# Patient Record
Sex: Male | Born: 1960 | Race: White | Hispanic: No | Marital: Married | Smoking: Never smoker
Health system: Southern US, Community
[De-identification: ages and names within clinical notes are randomized; demographics above are authoritative.]

## PROBLEM LIST (undated history)

## (undated) DIAGNOSIS — J309 Allergic rhinitis, unspecified: Secondary | ICD-10-CM

## (undated) DIAGNOSIS — K219 Gastro-esophageal reflux disease without esophagitis: Secondary | ICD-10-CM

## (undated) DIAGNOSIS — Z87898 Personal history of other specified conditions: Secondary | ICD-10-CM

## (undated) DIAGNOSIS — I517 Cardiomegaly: Secondary | ICD-10-CM

## (undated) DIAGNOSIS — J45909 Unspecified asthma, uncomplicated: Secondary | ICD-10-CM

## (undated) HISTORY — PX: DENTAL SURGERY: SHX609

## (undated) HISTORY — PX: HERNIA REPAIR: SHX51

## (undated) HISTORY — PX: COLONOSCOPY: SHX174

## (undated) HISTORY — DX: Unspecified asthma, uncomplicated: J45.909

## (undated) HISTORY — DX: Allergic rhinitis, unspecified: J30.9

---

## 2004-02-24 ENCOUNTER — Emergency Department (HOSPITAL_COMMUNITY): Admission: EM | Admit: 2004-02-24 | Discharge: 2004-02-25 | Payer: Self-pay | Admitting: *Deleted

## 2005-04-13 IMAGING — CR DG TIBIA/FIBULA 2V*R*
2 series · 2 of 2 positions shown · non-contrast
Comparison: none

CLINICAL DATA: patient fell of skateboarding and has pain along the lateral aspect of the leg
 TWO VIEW RIGHT TIBIA/FIBULA
 No prior studies.

[view not recorded (1 of 2)]
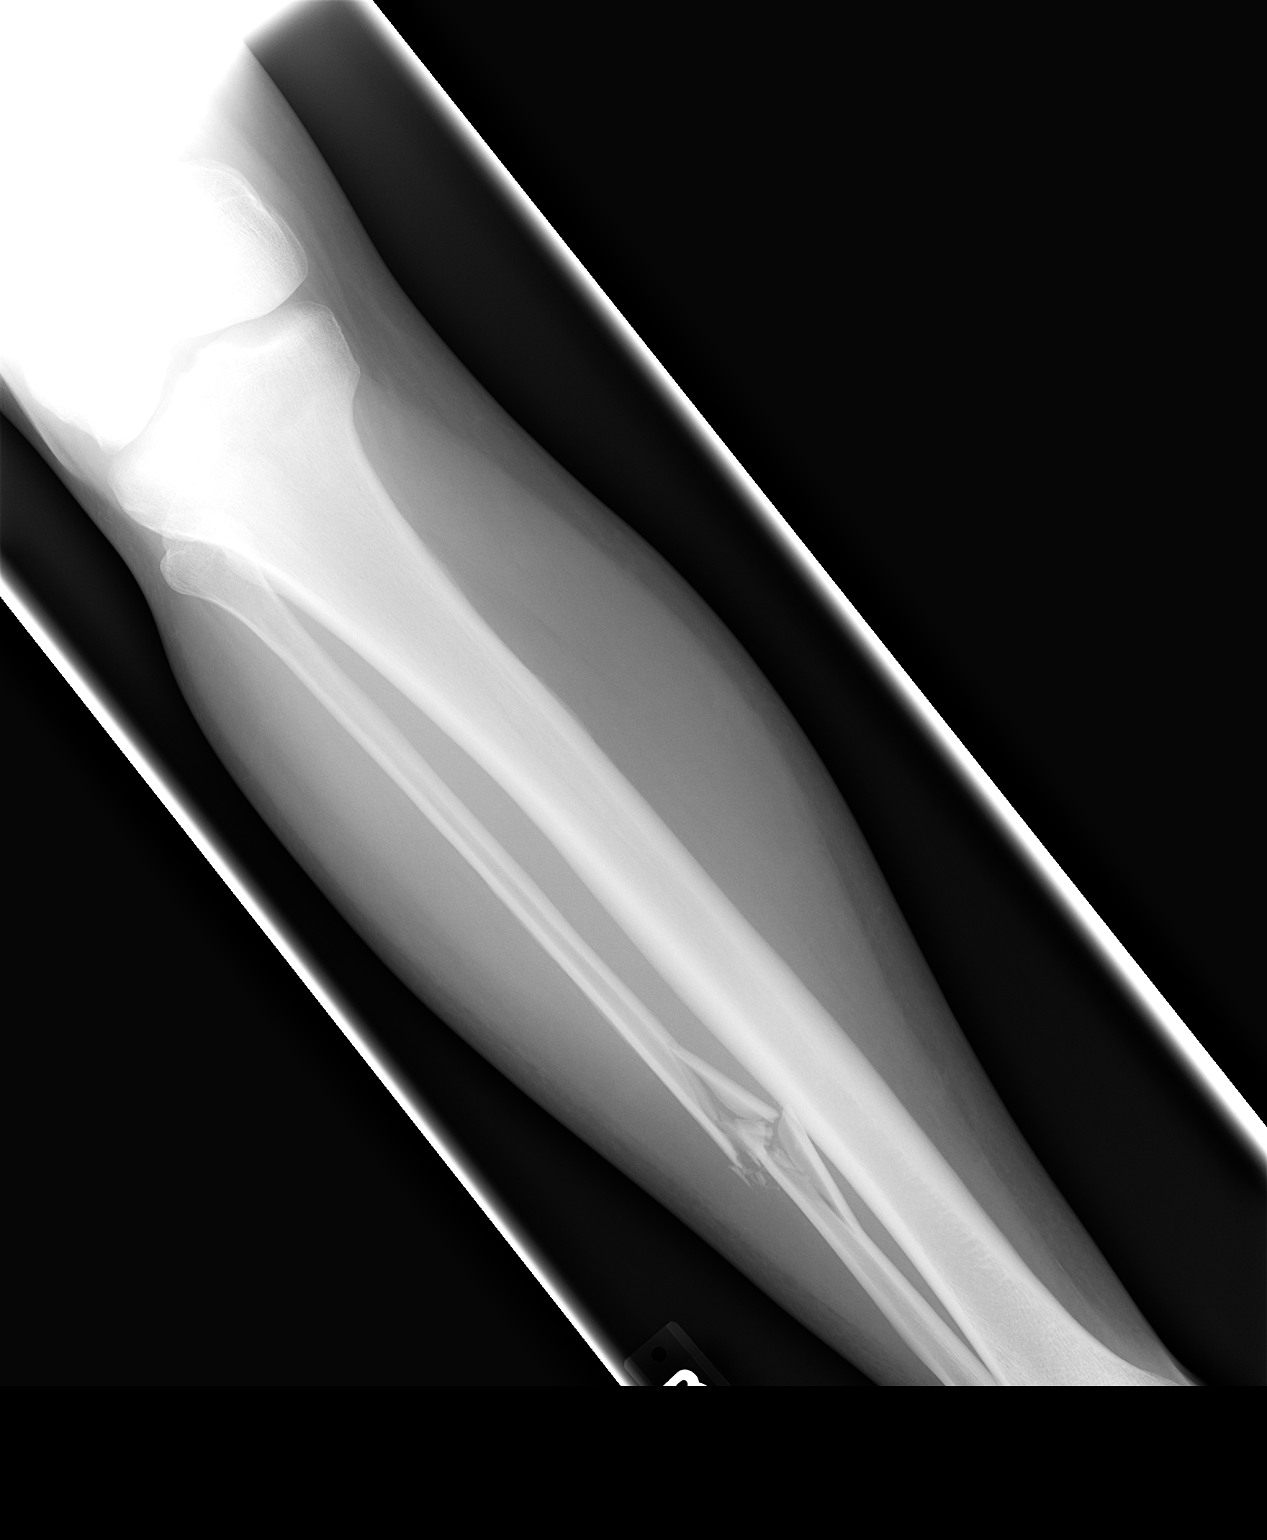

[view not recorded (2 of 2)]
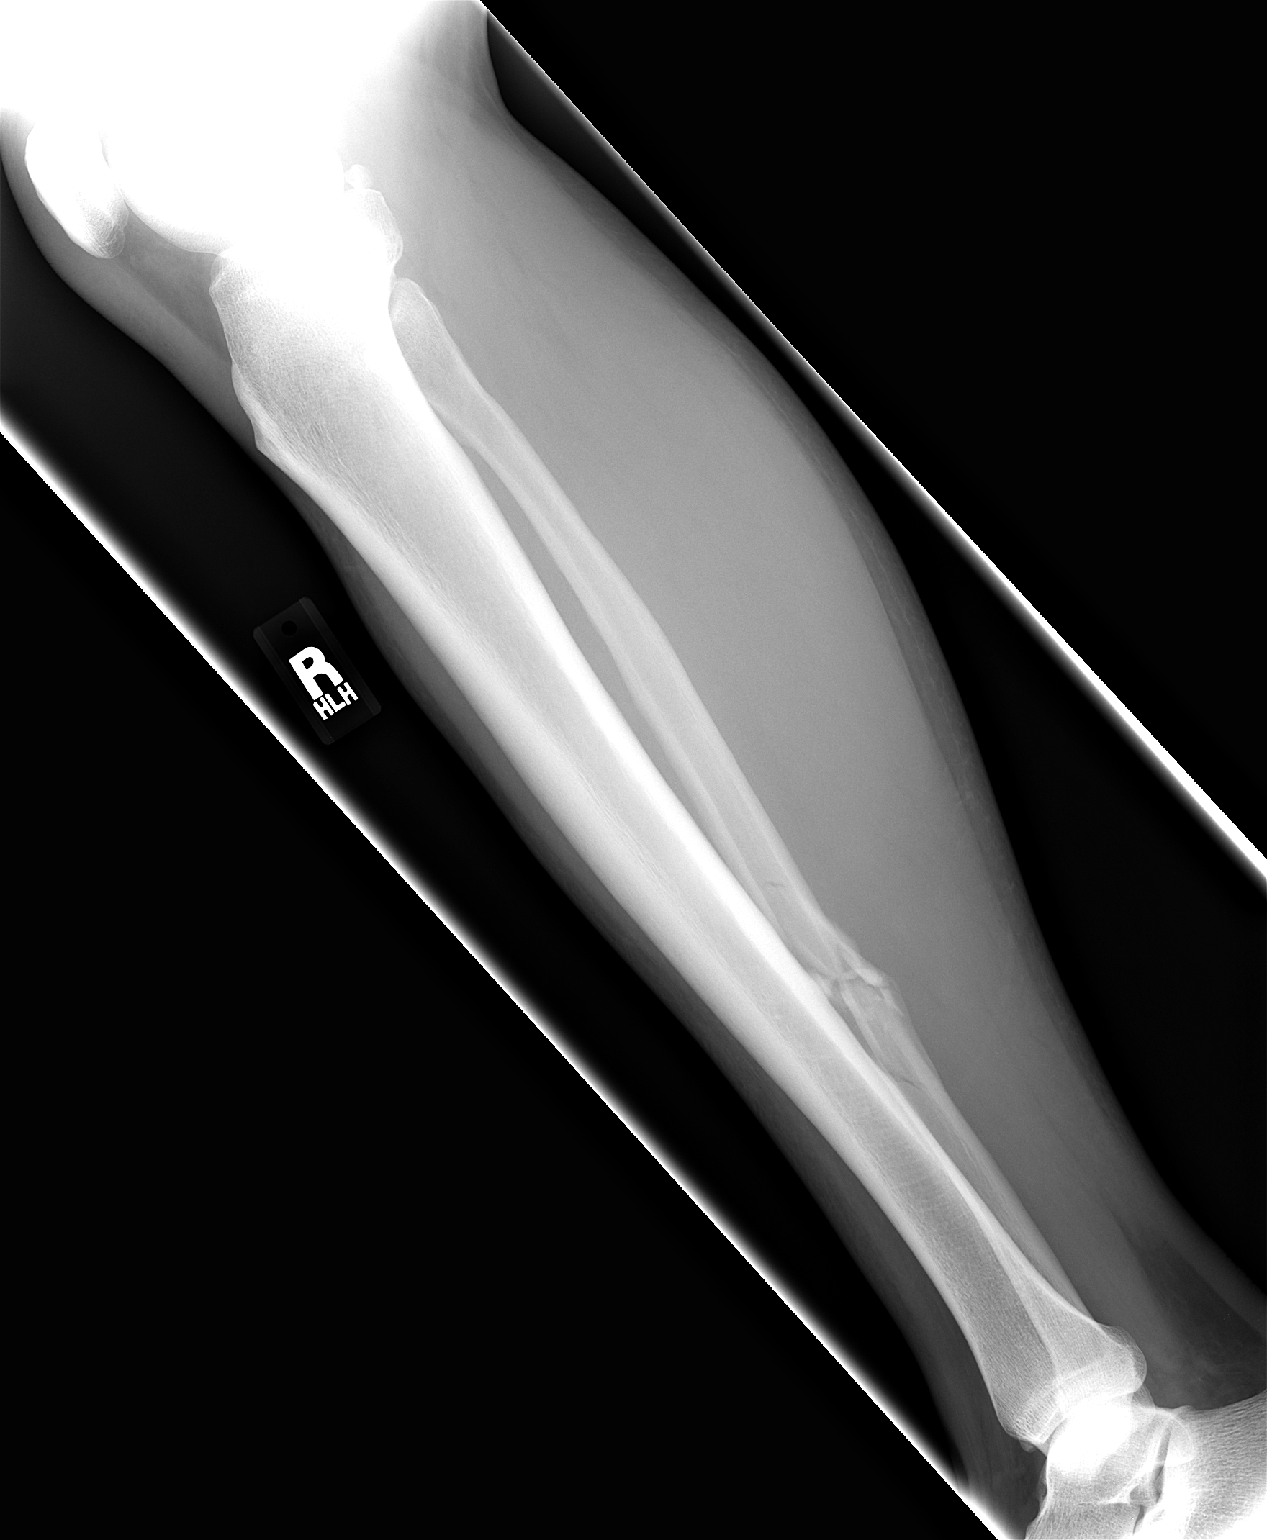

[2 of 2 positions shown; findings below may reference images not displayed]

FINDINGS: There is a comminuted fracture of the fibula at the junction of the mid and distal thirds of the diaphysis.  The tibia appears intact.
 IMPRESSION
 Comminuted fibular diaphyseal fracture.

## 2015-06-10 ENCOUNTER — Ambulatory Visit
Admission: RE | Admit: 2015-06-10 | Discharge: 2015-06-10 | Disposition: A | Discharge status: Home / Self Care | Payer: Managed Care, Other (non HMO) | Source: Ambulatory Visit | Attending: Allergy | Admitting: Allergy

## 2015-06-10 ENCOUNTER — Other Ambulatory Visit: Payer: Self-pay | Admitting: Allergy

## 2015-06-10 DIAGNOSIS — J45909 Unspecified asthma, uncomplicated: Secondary | ICD-10-CM | POA: Diagnosis not present

## 2015-06-10 DIAGNOSIS — J4530 Mild persistent asthma, uncomplicated: Secondary | ICD-10-CM

## 2015-06-10 DIAGNOSIS — I517 Cardiomegaly: Secondary | ICD-10-CM | POA: Insufficient documentation

## 2015-09-06 ENCOUNTER — Encounter (INDEPENDENT_AMBULATORY_CARE_PROVIDER_SITE_OTHER): Payer: Self-pay

## 2015-09-06 ENCOUNTER — Ambulatory Visit (INDEPENDENT_AMBULATORY_CARE_PROVIDER_SITE_OTHER): Payer: Managed Care, Other (non HMO) | Admitting: Pulmonary Disease

## 2015-09-06 ENCOUNTER — Encounter: Payer: Self-pay | Admitting: Pulmonary Disease

## 2015-09-06 VITALS — BP 118/82 | HR 83 | Ht 68.0 in | Wt 174.0 lb

## 2015-09-06 DIAGNOSIS — R05 Cough: Secondary | ICD-10-CM

## 2015-09-06 DIAGNOSIS — J45909 Unspecified asthma, uncomplicated: Secondary | ICD-10-CM | POA: Diagnosis not present

## 2015-09-06 DIAGNOSIS — K219 Gastro-esophageal reflux disease without esophagitis: Secondary | ICD-10-CM

## 2015-09-06 DIAGNOSIS — R059 Cough, unspecified: Secondary | ICD-10-CM

## 2015-09-06 DIAGNOSIS — J309 Allergic rhinitis, unspecified: Secondary | ICD-10-CM

## 2015-09-06 MED ORDER — ESOMEPRAZOLE MAGNESIUM 40 MG PO PACK: 40.0000 mg | PACK | Freq: Every day | ORAL | Status: DC

## 2015-09-06 NOTE — Patient Instructions (Signed)
Continue Zyrtec and Breo Begin Nexium 40 mg daily Follow up in 4-6 weeks with pulmonary function tests at that time

## 2015-09-06 NOTE — Progress Notes (Signed)
PULMONARY CONSULT NOTE  Date of initial consultation: 09/06/15 Reason for consultation: Chronic cough   HPI:  36 M who has had chronic throat clearing cough since his teens. He has seen Dr [place] Callas, allergist, who diagnosed asthma 2-3 yrs ago based on spirometry. He has been treated with montelukast and more recently had Breo added to his regimen. E does not believe that Virgel Bouquet has helped his cough. He reports mild DOE without much day to day variation. He has a past history of repeated bouts of "bronchitis". He notes his cough is worse when he exerts himself in hot weather or conditions. He denies nocturnal awakenings due to cough or dyspnea. He denies heartburn symptoms, chest pain, hemoptysis, LE edema and calf tenderness.  Past Medical History  Diagnosis Date  . Allergic rhinitis   . Asthma     History reviewed. No pertinent past surgical history.  MEDICATIONS: I have reviewed all medications and confirmed regimen as documented  Social History   Social History  . Marital Status: Married    Spouse Name: N/A  . Number of Children: N/A  . Years of Education: N/A   Occupational History  . Not on file.   Social History Main Topics  . Smoking status: Never Smoker   . Smokeless tobacco: Not on file  . Alcohol Use: No  . Drug Use: No  . Sexual Activity: Not on file   Other Topics Concern  . Not on file   Social History Narrative  . No narrative on file    Family History  Problem Relation Age of Onset  . Asthma Mother   . Asthma Maternal Aunt   . COPD Maternal Grandfather     ROS: No fever, myalgias/arthralgias, unexplained weight loss or weight gain No new focal weakness or sensory deficits No otalgia, hearing loss, visual changes, nasal and sinus symptoms, mouth and throat problems No neck pain or adenopathy No abdominal pain, N/V/D, diarrhea, change in bowel pattern No dysuria, change in urinary pattern No LE edema or calf tenderness   Filed Vitals:   09/06/15  0916  BP: 118/82  Pulse: 83  Height:  (1.727 m)  Weight: 174 lb (78.926 kg)  SpO2: 95%     EXAM:  Gen: WDWN, No distress HEENT: NCAT, TMs and canals normal, sclera white, nares and nasal mucosa normal, oropharynx normal Neck: Supple without LAN, thyromegaly, JVD Lungs: breath sounds full, percussion note normal throughout, No adventitious sounds Cardiovascular: Reg rhythm, rate normal, no murmurs noted Abdomen: Soft, nontender, normal BS Ext: without clubbing, cyanosis, edema Neuro: CNs grossly intact, motor and sensory intact, DTRs symmetric Skin: Limited exam, no lesions noted  DATA:   No flowsheet data found.  No flowsheet data found.  CXR:  06/10/15 - entirely normal  IMPRESSION:     ICD-9-CM ICD-10-CM   1. Cough 786.2 R05 Pulmonary function test  2. Allergic rhinitis, unspecified allergic rhinitis type 477.9 J30.9   3. Asthma, unspecified asthma severity, uncomplicated 493.90 J45.909 Pulmonary function test  4. Gastroesophageal reflux disease, esophagitis presence not specified 530.81 K21.9    His cough is likely multifactorial due to asthma, rhinosinusitis which are already medically treated. The throat clearing nature suggests GERD/LPR as a likely contributor as well    PLAN:  Add Nexium 40 mg daily Cont montelukast/Breo/cetirizine  Follow up in 4-6 weeks with PFTs   Merwyn Katos, MD [facility] [place] Pulmonary, Critical Care Medicine

## 2015-10-07 ENCOUNTER — Other Ambulatory Visit: Payer: Self-pay | Admitting: *Deleted

## 2015-10-07 ENCOUNTER — Ambulatory Visit (INDEPENDENT_AMBULATORY_CARE_PROVIDER_SITE_OTHER): Payer: Managed Care, Other (non HMO) | Admitting: *Deleted

## 2015-10-07 DIAGNOSIS — R059 Cough, unspecified: Secondary | ICD-10-CM

## 2015-10-07 DIAGNOSIS — R05 Cough: Secondary | ICD-10-CM | POA: Diagnosis not present

## 2015-10-07 DIAGNOSIS — J45909 Unspecified asthma, uncomplicated: Secondary | ICD-10-CM

## 2015-10-07 LAB — PULMONARY FUNCTION TEST
DL/VA % PRED: 99 %
DL/VA: 4.48 ml/min/mmHg/L
DLCO UNC % PRED: 87 %
DLCO unc: 25.92 ml/min/mmHg
FEF 25-75 POST: 3.32 L/s
FEF 25-75 PRE: 5.21 L/s
FEF2575-%CHANGE-POST: -36 %
FEF2575-%PRED-PRE: 164 %
FEF2575-%Pred-Post: 104 %
FEV1-%Change-Post: -5 %
FEV1-%Pred-Post: 88 %
FEV1-%Pred-Pre: 94 %
FEV1-PRE: 3.39 L
FEV1-Post: 3.2 L
FEV1FVC-%CHANGE-POST: -3 %
FEV1FVC-%Pred-Pre: 114 %
FEV6-%CHANGE-POST: -1 %
FEV6-%PRED-POST: 84 %
FEV6-%Pred-Pre: 85 %
FEV6-Post: 3.76 L
FEV6-Pre: 3.82 L
FEV6FVC-%Pred-Post: 104 %
FEV6FVC-%Pred-Pre: 104 %
FVC-%Change-Post: -2 %
FVC-%Pred-Post: 80 %
FVC-%Pred-Pre: 82 %
FVC-Post: 3.76 L
FVC-Pre: 3.86 L
POST FEV1/FVC RATIO: 85 %
PRE FEV1/FVC RATIO: 88 %
Post FEV6/FVC ratio: 100 %
Pre FEV6/FVC Ratio: 100 %
RV % pred: 76 %
RV: 1.51 L
TLC % PRED: 82 %
TLC: 5.44 L

## 2015-10-07 MED ORDER — OMEPRAZOLE 20 MG PO CPDR: 20.0000 mg | DELAYED_RELEASE_CAPSULE | Freq: Every day | ORAL | Status: DC

## 2015-10-07 NOTE — Progress Notes (Signed)
PFT performed today. 

## 2015-10-11 ENCOUNTER — Ambulatory Visit (INDEPENDENT_AMBULATORY_CARE_PROVIDER_SITE_OTHER): Payer: Managed Care, Other (non HMO) | Admitting: Pulmonary Disease

## 2015-10-11 ENCOUNTER — Encounter: Payer: Self-pay | Admitting: Pulmonary Disease

## 2015-10-11 VITALS — BP 120/62 | HR 72 | Ht 68.0 in | Wt 174.6 lb

## 2015-10-11 DIAGNOSIS — R059 Cough, unspecified: Secondary | ICD-10-CM

## 2015-10-11 DIAGNOSIS — J387 Other diseases of larynx: Secondary | ICD-10-CM | POA: Diagnosis not present

## 2015-10-11 DIAGNOSIS — K219 Gastro-esophageal reflux disease without esophagitis: Secondary | ICD-10-CM

## 2015-10-11 DIAGNOSIS — R05 Cough: Secondary | ICD-10-CM | POA: Diagnosis not present

## 2015-10-12 NOTE — Progress Notes (Signed)
PULMONARY FOLLOW UP NOTE  Date of initial consultation: 09/06/15 Reason for consultation: Chronic cough  27 M who has had chronic throat clearing cough since his teens. He has seen Dr [place] Callas, allergist, who diagnosed asthma 2-3 yrs ago based on spirometry. He has been treated with montelukast and more recently had Breo added to his regimen. E does not believe that Virgel Bouquet has helped his cough. He reports mild DOE without much day to day variation. He has a past history of repeated bouts of "bronchitis". He notes his cough is worse when he exerts himself in hot weather or conditions. He denies nocturnal awakenings due to cough or dyspnea. He denies heartburn symptoms, chest pain, hemoptysis, LE edema and calf tenderness. Initial IMP/plan: Cough due to GERD/LPR: Nexium 40 mg daily. Cont montelukast/Breo/cetirizine  Follow up in 4-6 weeks with PFTs.   02/28 ROV 02/28: Cough much improved to resolved. PFTs entirely normal. IMPRESSION: Cough due to GERD/LPR. Continue PPI (now on omeprazole) for 2 more weeks. May trial off PPI after that and use PRN for cough or GERD sx. Also discussed possibility of using OTC H2RAs. F/U PRN  SUBJ: Cough fully resolved. No new complaints. Was not approved for esomperazole. Using omeprazole instead  Filed Vitals:   10/11/15 0859  BP: 120/62  Pulse: 72  Height:  (1.727 m)  Weight: 174 lb 9.6 oz (79.198 kg)  SpO2: 94%   EXAM:  Gen: WDWN, No distress HEENT: NCAT, oropharynx normal Neck: Supple without LAN, thyromegaly, JVD Lungs: breath sounds full, percussion note normal throughout, No adventitious sounds Cardiovascular: Reg rhythm, rate normal, no murmurs noted Abdomen: Soft, nontender, normal BS Ext: without clubbing, cyanosis, edema  DATA:  No new data  IMPRESSION:     ICD-9-CM ICD-10-CM   1. Cough 786.2 R05   2. LPRD (laryngopharyngeal reflux disease) 478.79 J38.7    Cough is resolved after starting PPI. This essentially confirms the dx of  GERD/LPR  PLAN:  Continue PPI for 2 more weeks on a scheduled basis. After that, he may try off or transition to H2RA F/U PRN   Merwyn Katos, MD [facility] Pulmonary, Critical Care Medicine

## 2016-05-07 ENCOUNTER — Other Ambulatory Visit: Payer: Self-pay

## 2016-05-07 MED ORDER — OMEPRAZOLE 20 MG PO CPDR: 20.0000 mg | DELAYED_RELEASE_CAPSULE | Freq: Every day | ORAL | 0 refills | Status: DC

## 2016-06-06 ENCOUNTER — Other Ambulatory Visit: Payer: Self-pay | Admitting: *Deleted

## 2016-06-06 MED ORDER — OMEPRAZOLE 20 MG PO CPDR: 20.0000 mg | DELAYED_RELEASE_CAPSULE | Freq: Every day | ORAL | 0 refills | Status: DC

## 2016-06-29 ENCOUNTER — Other Ambulatory Visit: Payer: Self-pay | Admitting: *Deleted

## 2016-06-29 MED ORDER — OMEPRAZOLE 20 MG PO CPDR: 20.0000 mg | DELAYED_RELEASE_CAPSULE | Freq: Every day | ORAL | 0 refills | Status: DC

## 2016-07-27 IMAGING — CR DG CHEST 2V
1 series · 2 of 2 positions shown · non-contrast
Comparison: None.

CLINICAL DATA: Asthma

EXAM:
CHEST  2 VIEW

[Series 1: w chest pa · 0.14mm/px · 2 of 2 slices shown]
[im 1/2]
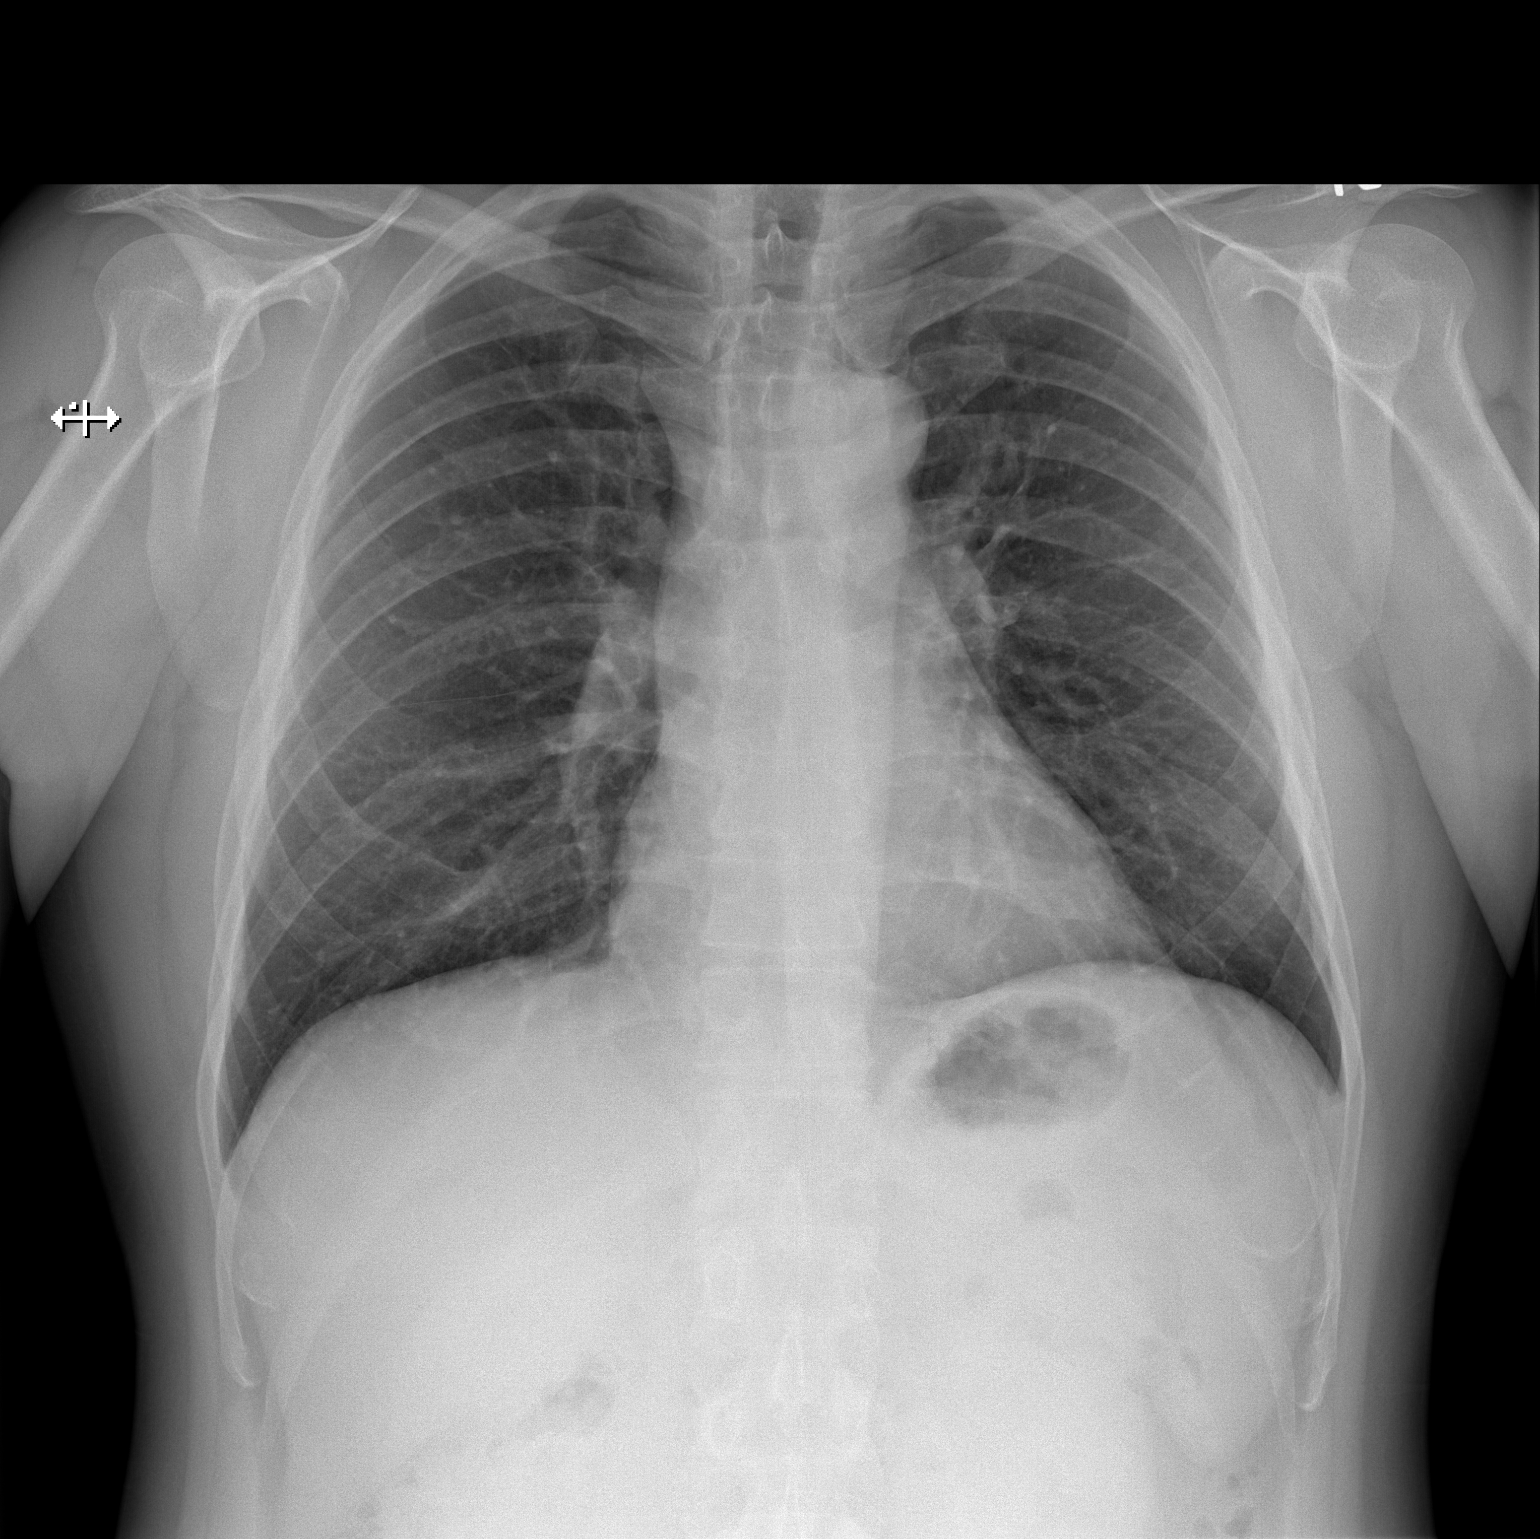
[im 2/2]
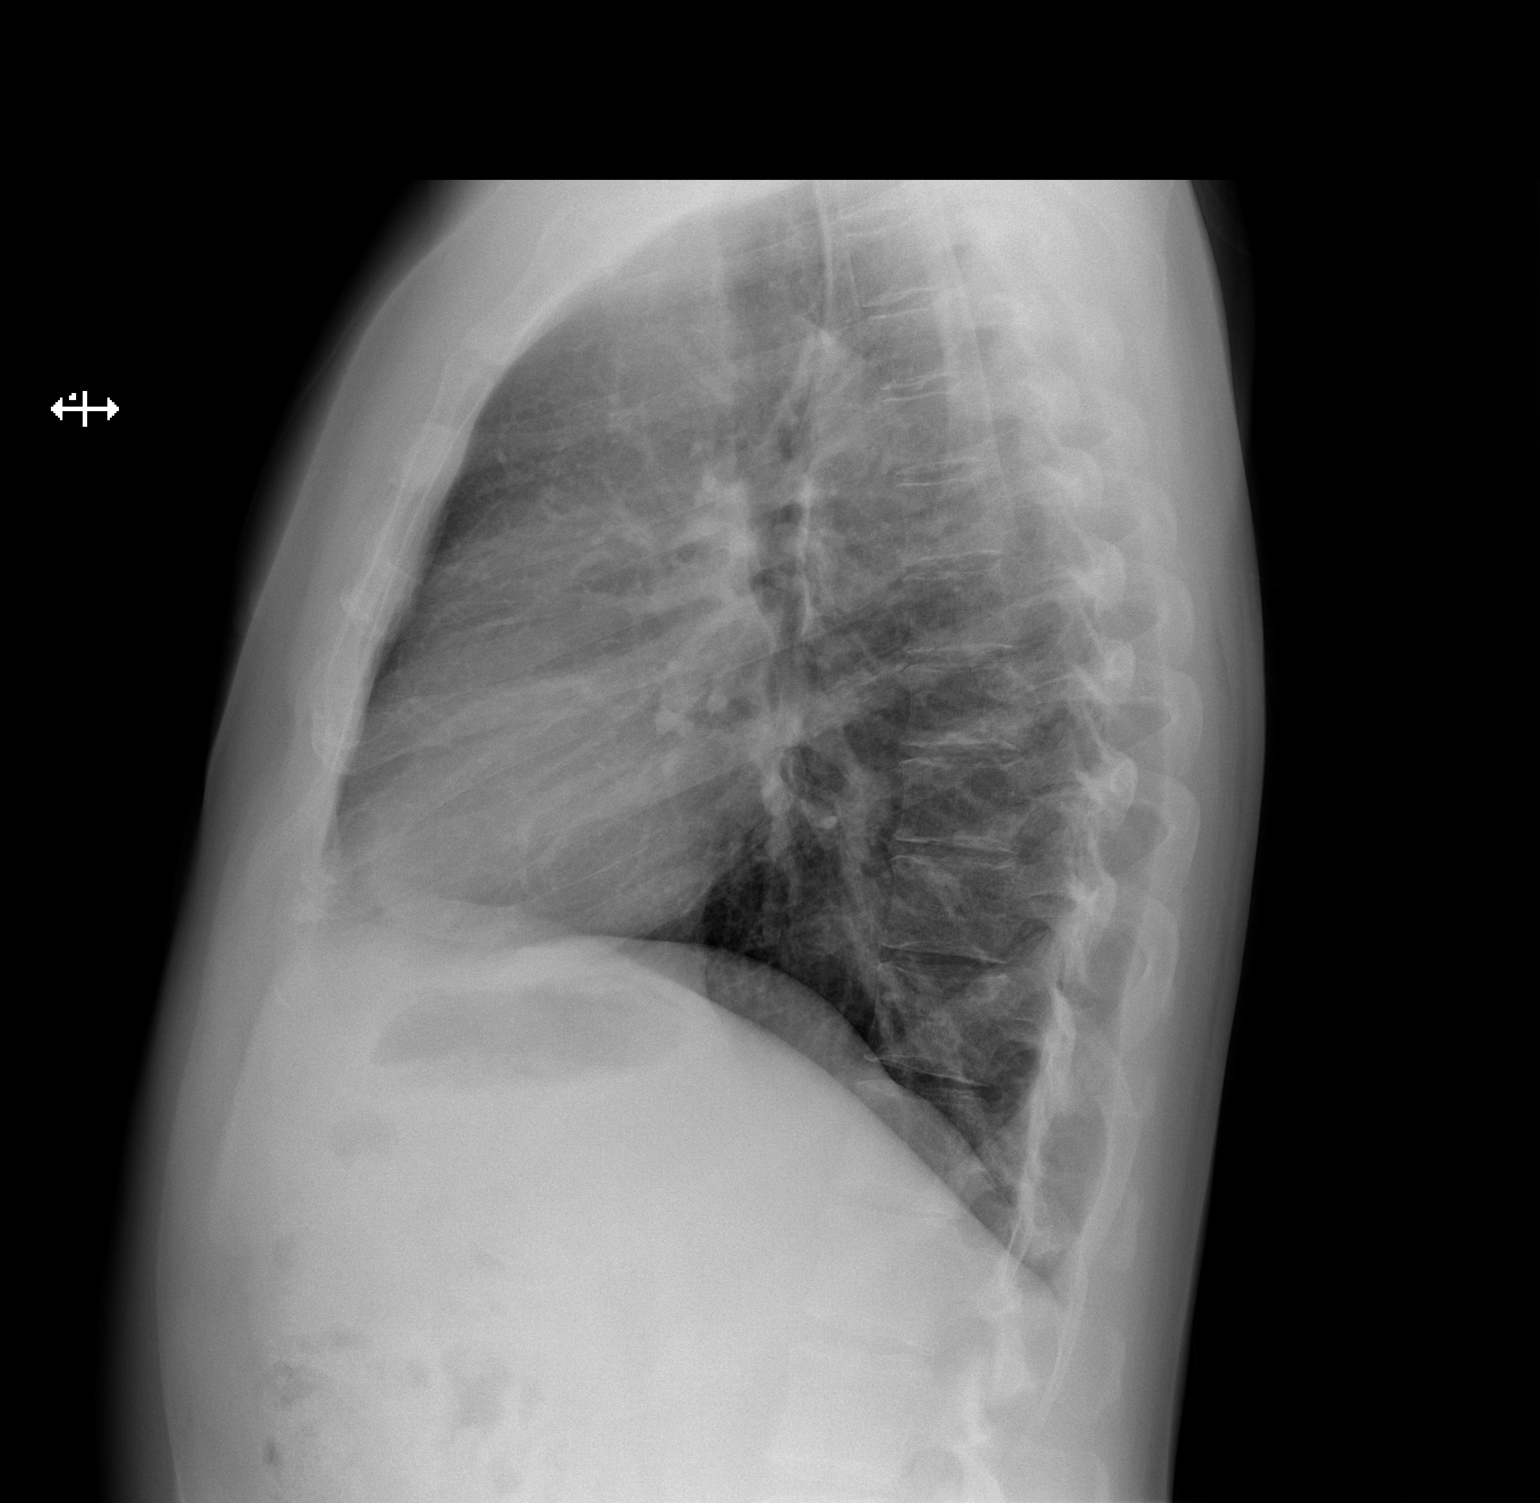

[2 of 2 positions shown; findings below may reference images not displayed]

FINDINGS: Borderline cardiomegaly. Clear lungs. Normal vascularity. No
pneumothorax. No pleural effusion.
IMPRESSION: Borderline cardiomegaly without decompensation.

## 2016-07-30 ENCOUNTER — Other Ambulatory Visit: Payer: Self-pay | Admitting: *Deleted

## 2016-07-30 MED ORDER — OMEPRAZOLE 20 MG PO CPDR: 20.0000 mg | DELAYED_RELEASE_CAPSULE | Freq: Every day | ORAL | 0 refills | Status: DC

## 2016-08-31 ENCOUNTER — Other Ambulatory Visit: Payer: Self-pay | Admitting: Pulmonary Disease

## 2016-09-26 ENCOUNTER — Other Ambulatory Visit: Payer: Self-pay | Admitting: Pulmonary Disease

## 2019-02-25 ENCOUNTER — Ambulatory Visit (HOSPITAL_COMMUNITY)
Admission: EM | Admit: 2019-02-25 | Discharge: 2019-02-25 | Disposition: A | Discharge status: Home / Self Care | Payer: 59 | Attending: Urgent Care | Admitting: Urgent Care

## 2019-02-25 ENCOUNTER — Other Ambulatory Visit: Payer: Self-pay

## 2019-02-25 ENCOUNTER — Encounter (HOSPITAL_COMMUNITY): Payer: Self-pay

## 2019-02-25 DIAGNOSIS — M79604 Pain in right leg: Secondary | ICD-10-CM

## 2019-02-25 MED ORDER — NAPROXEN 500 MG PO TABS: 500.0000 mg | ORAL_TABLET | Freq: Two times a day (BID) | ORAL | 0 refills | Status: DC

## 2019-02-25 NOTE — ED Triage Notes (Signed)
Pt states he has a swelling in his leg and calve he says it warm to the touch at times. Pt states he has a popping sound when he bend his ankle.

## 2019-02-25 NOTE — ED Provider Notes (Signed)
MRN: [id number] DOB: 02-15-1961  Subjective:   TAB RYLEE is a 58 y.o. male presenting for several day history of intermittent mild to moderate lower leg pain/aching.  His pain has moved between his distal posterior hamstring to his calf to his lower leg and hears intermittent popping of his ankle.  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  He denies long distance travel, recent surgeries.  Denies history of clotting disorder, history of DVT.  However his wife is concerned about DVT.  Has occasionally used some ibuprofen with good relief.  He does have a PCP and has been advised that he needs to hydrate better as patient drinks mainly soda.  No current facility-administered medications for this encounter.   Current Outpatient Medications:  .  albuterol (VENTOLIN HFA) 108 (90 Base) MCG/ACT inhaler, Inhale 1 puff into the lungs every 6 (six) hours as needed for wheezing or shortness of breath., Disp: , Rfl:  .  cetirizine (ZYRTEC) 10 MG tablet, Take 10 mg by mouth daily. Mainly during allergy season, Disp: , Rfl:  .  esomeprazole (NEXIUM) 40 MG packet, Take 40 mg by mouth daily before breakfast., Disp: 30 each, Rfl: 12 .  Fluticasone Furoate-Vilanterol 100-25 MCG/INH AEPB, Inhale 1 puff into the lungs daily., Disp: , Rfl:  .  montelukast (SINGULAIR) 5 MG chewable tablet, Chew 5 mg by mouth at bedtime., Disp: , Rfl:  .  naproxen (NAPROSYN) 500 MG tablet, Take 1 tablet (500 mg total) by mouth 2 (two) times daily., Disp: 30 tablet, Rfl: 0 .  omeprazole (PRILOSEC) 20 MG capsule, TAKE ONE CAPSULE BY MOUTH DAILY, Disp: 30 capsule, Rfl: 0   No Known Allergies  Past Medical History:  Diagnosis Date  . Allergic rhinitis   . Asthma      No past surgical history on file.  ROS  Objective:   Vitals: BP (!) 129/92 (BP Location: Right Arm)   Pulse 77   Temp 98.8 F (37.1 C)   Wt 160 lb (72.6 kg)   SpO2 97%   BMI 24.33 kg/m   Physical  Exam Constitutional:      Appearance: Normal appearance. He is well-developed and normal weight.  HENT:     Head: Normocephalic and atraumatic.     Right Ear: External ear normal.     Left Ear: External ear normal.     Nose: Nose normal.     Mouth/Throat:     Pharynx: Oropharynx is clear.  Eyes:     Extraocular Movements: Extraocular movements intact.     Pupils: Pupils are equal, round, and reactive to light.  Cardiovascular:     Rate and Rhythm: Normal rate.  Pulmonary:     Effort: Pulmonary effort is normal.  Musculoskeletal:     Right knee: He exhibits normal range of motion, no swelling, no effusion, no ecchymosis, no deformity, no laceration, no erythema, normal alignment and normal patellar mobility. No tenderness found.     Right ankle: He exhibits normal range of motion, no swelling, no ecchymosis, no deformity and normal pulse. No tenderness. Achilles tendon exhibits no pain and no defect.     Right lower leg: He exhibits no tenderness, no bony tenderness, no swelling, no deformity and no laceration. No edema.  Neurological:     Mental Status: He is alert and oriented to person, place, and time.  Psychiatric:        Mood and Affect: Mood normal.  Behavior: Behavior normal.     Assessment and Plan :   1. Musculoskeletal leg pain, right     Patient has minimal risk factors for dvt, PE findings reassuring. Will use NSAID, hydration. Patient will f/u with PCP for U/S. Counseled patient on potential for adverse effects with medications prescribed/recommended today, ER and return-to-clinic precautions discussed, patient verbalized understanding.    Wallis BambergMani, Elaysia Devargas, [place]PA-C 02/25/19 2028

## 2019-02-25 NOTE — Discharge Instructions (Addendum)
Hydrate well with at least 2 liters (64 ounces) of water daily. You may take 500mg -650mg  Tylenol every 6 hours for pain and inflammation. Take naproxen for pain and inflammation with food.

## 2022-12-31 ENCOUNTER — Ambulatory Visit: Payer: Self-pay | Admitting: Surgery

## 2022-12-31 NOTE — H&P (Signed)
Subjective:   CC: Umbilical hernia without obstruction and without gangrene [K42.9]  HPI:  David Henson is a 62 y.o. male who was referred by Sandie Ano, MD for evaluation of above. First noted several years ago, recently increasing in size.  No major symptoms but will like to get if fixed.  Past Medical History:  has a past medical history of Allergic state, Asthma without status asthmaticus (HHS-HCC), and GERD (gastroesophageal reflux disease).  Past Surgical History:  Past Surgical History:  Procedure Laterality Date   COLONOSCOPY  07/2013   10 yrs    Family History: family history includes Asthma in his mother; Breast cancer in his mother.  Social History:  reports that he has never smoked. He has never used smokeless tobacco. He reports that he does not drink alcohol and does not use drugs.  Current Medications: has a current medication list which includes the following prescription(s): cetirizine hcl, esomeprazole, and montelukast.  Allergies:  Allergies as of 12/31/2022   (No Known Allergies)    ROS:  A 15 point review of systems was performed and pertinent positives and negatives noted in HPI   Objective:     BP (!) 143/85   Pulse 94   Ht 170.2 cm (5\' 7" )   Wt 86.2 kg (190 lb)   BMI 29.76 kg/m   Constitutional :  Alert, cooperative, no distress  Lymphatics/Throat:  Supple, no lymphadenopathy  Respiratory:  clear to auscultation bilaterally  Cardiovascular:  regular rate and rhythm  Gastrointestinal: soft, non-tender; bowel sounds normal; no masses,  no organomegaly. umbilical hernia noted.  small, reducible, and no overlying skin changes  Musculoskeletal: Steady gait and movement  Skin: Cool and moist  Psychiatric: Normal affect, non-agitated, not confused       LABS:     RADS: N/a3 Assessment:       Umbilical hernia without obstruction and without gangrene [K42.9]  Plan:     1. Umbilical hernia without obstruction and without gangrene  [K42.9]   Discussed the risk of surgery including recurrence, which can be up to 50% in the case of incisional or complex hernias, possible use of prosthetic materials (mesh) and the increased risk of mesh infxn if used, bleeding, chronic pain, post-op infxn, post-op SBO or ileus, and possible re-operation to address said risks. The risks of general anesthetic, if used, includes MI, CVA, sudden death or even reaction to anesthetic medications also discussed. Alternatives include continued observation.  Benefits include possible symptom relief, prevention of incarceration, strangulation, enlargement in size over time, and the risk of emergency surgery in the face of strangulation.   Typical post-op recovery time of 3-5 days with 2 weeks of activity restrictions were also discussed.  ED return precautions given for sudden increase in pain, size of hernia with accompanying fever, nausea, and/or vomiting.  The patient verbalized understanding and all questions were answered to the patient's satisfaction.   2. Patient has elected to proceed with surgical treatment. Procedure will be scheduled. robotic assisted laparoscopic  labs/images/medications/previous chart entries reviewed personally and relevant changes/updates noted above.

## 2023-02-26 ENCOUNTER — Encounter
Admission: RE | Admit: 2023-02-26 | Discharge: 2023-02-26 | Disposition: A | Discharge status: Home / Self Care | Payer: 59 | Source: Ambulatory Visit | Attending: Surgery | Admitting: Surgery

## 2023-02-26 ENCOUNTER — Other Ambulatory Visit: Payer: Self-pay

## 2023-02-26 ENCOUNTER — Encounter: Payer: Self-pay | Admitting: Surgery

## 2023-02-26 HISTORY — DX: Gastro-esophageal reflux disease without esophagitis: K21.9

## 2023-02-26 NOTE — Patient Instructions (Addendum)
Your procedure is scheduled on: Friday 03/08/23 To find out your arrival time, please call [phone] between 1PM - 3PM on:   Thursday 03/07/23 Report to the Registration Desk on the 1st floor of the [facility]. Free Valet parking is available.  If your arrival time is 6:00 am, do not arrive before that time as the [facility] entrance doors do not open until 6:00 am.  REMEMBER: Instructions that are not followed completely may result in serious medical risk, up to and including death; or upon the discretion of your surgeon and anesthesiologist your surgery may need to be rescheduled.  Do not eat food after midnight the night before surgery.  No gum chewing or hard candies.  You may however, drink CLEAR liquids up to 2 hours before you are scheduled to arrive for your surgery. Do not drink anything within 2 hours of your scheduled arrival time.  Clear liquids include: - water  - apple juice without pulp - gatorade (not RED colors) - black coffee or tea (Do NOT add milk or creamers to the coffee or tea) Do NOT drink anything that is not on this list.  Type 1 and Type 2 diabetics should only drink water.  One week prior to surgery: Stop Anti-inflammatories (NSAIDS) such as Advil, Aleve, Ibuprofen, Motrin, Naproxen, Naprosyn and Aspirin based products such as Excedrin, Goody's Powder, BC Powder. You may however, continue to take Tylenol if needed for pain up until the day of surgery.  Stop ANY OVER THE COUNTER supplements and vitamins for 7 days until after surgery.  Continue taking all prescription medications.  TAKE ONLY THESE MEDICATIONS THE MORNING OF SURGERY WITH A SIP OF WATER:  esomeprazole (NEXIUM) 20 MG capsule Antacid (take one the night before and one on the morning of surgery - helps to prevent nausea after surgery.)  Use inhalers on the day of surgery if you are having any asthma symptoms.   No Alcohol for 24 hours before or after surgery.  No Smoking including  e-cigarettes for 24 hours before surgery.  No chewable tobacco products for at least 6 hours before surgery.  No nicotine patches on the day of surgery.  Do not use any "recreational" drugs for at least a week (preferably 2 weeks) before your surgery.  Please be advised that the combination of cocaine and anesthesia may have negative outcomes, up to and including death. If you test positive for cocaine, your surgery will be cancelled.  On the morning of surgery brush your teeth with toothpaste and water, you may rinse your mouth with mouthwash if you wish. Do not swallow any toothpaste or mouthwash.  Use CHG Soap or wipes as directed on instruction sheet.  Do not wear lotions, powders, or perfumes.   Do not shave body hair from the neck down 48 hours before surgery.  Wear comfortable clothing (specific to your surgery type) to the hospital.  Do not wear jewelry, make-up, hairpins, clips or nail polish.  Contact lenses, hearing aids and dentures may not be worn into surgery.  Do not bring valuables to the hospital. [facility] is not responsible for any missing/lost belongings or valuables.   Notify your doctor if there is any change in your medical condition (cold, fever, infection).  If you are being discharged the day of surgery, you will not be allowed to drive home. You will need a responsible individual to drive you home and stay with you for 24 hours after surgery.   If you are  taking public transportation, you will need to have a responsible individual with you.  If you are being admitted to the hospital overnight, leave your suitcase in the car. After surgery it may be brought to your room.  In case of increased patient census, it may be necessary for you, the patient, to continue your postoperative care in the Same Day Surgery department.  After surgery, you can help prevent lung complications by doing breathing exercises.  Take deep breaths and cough every 1-2 hours.  Your doctor may order a device called an Incentive Spirometer to help you take deep breaths. When coughing or sneezing, hold a pillow firmly against your incision with both hands. This is called "splinting." Doing this helps protect your incision. It also decreases belly discomfort.  Surgery Visitation Policy:  Patients undergoing a surgery or procedure may have two family members or support persons with them as long as the person is not COVID-19 positive or experiencing its symptoms.   Inpatient Visitation:    Visiting hours are 7 a.m. to 8 p.m. Up to four visitors are allowed at one time in a patient room. The visitors may rotate out with other people during the day. One designated support person (adult) may remain overnight.  Please call the Pre-admissions Testing Dept. at [phone] if you have any questions about these instructions.     Preparing for Surgery with CHLORHEXIDINE GLUCONATE (CHG) Soap  Chlorhexidine Gluconate (CHG) Soap  o An antiseptic cleaner that kills germs and bonds with the skin to continue killing germs even after washing  o Used for showering the night before surgery and morning of surgery  Before surgery, you can play an important role by reducing the number of germs on your skin.  CHG (Chlorhexidine gluconate) soap is an antiseptic cleanser which kills germs and bonds with the skin to continue killing germs even after washing.  Please do not use if you have an allergy to CHG or antibacterial soaps. If your skin becomes reddened/irritated stop using the CHG.  1. Shower the NIGHT BEFORE SURGERY and the MORNING OF SURGERY with CHG soap.  2. If you choose to wash your hair, wash your hair first as usual with your normal shampoo.  3. After shampooing, rinse your hair and body thoroughly to remove the shampoo.  4. Use CHG as you would any other liquid soap. You can apply CHG directly to the skin and wash gently with a scrungie or a clean washcloth.  5.  Apply the CHG soap to your body only from the neck down. Do not use on open wounds or open sores. Avoid contact with your eyes, ears, mouth, and genitals (private parts). Wash face and genitals (private parts) with your normal soap.  6. Wash thoroughly, paying special attention to the area where your surgery will be performed.  7. Thoroughly rinse your body with warm water.  8. Do not shower/wash with your normal soap after using and rinsing off the CHG soap.  9. Pat yourself dry with a clean towel.  10. Wear clean pajamas to bed the night before surgery.  12. Place clean sheets on your bed the night of your first shower and do not sleep with pets.  13. Shower again with the CHG soap on the day of surgery prior to arriving at the hospital.  14. Do not apply any deodorants/lotions/powders.  15. Please wear clean clothes to the hospital.

## 2023-03-04 ENCOUNTER — Encounter: Payer: Self-pay | Admitting: Urgent Care

## 2023-03-04 ENCOUNTER — Encounter
Admission: RE | Admit: 2023-03-04 | Discharge: 2023-03-04 | Disposition: A | Discharge status: Home / Self Care | Payer: 59 | Source: Ambulatory Visit | Attending: Surgery | Admitting: Surgery

## 2023-03-04 DIAGNOSIS — I517 Cardiomegaly: Secondary | ICD-10-CM | POA: Insufficient documentation

## 2023-03-04 DIAGNOSIS — I4510 Unspecified right bundle-branch block: Secondary | ICD-10-CM | POA: Diagnosis not present

## 2023-03-04 DIAGNOSIS — Z01810 Encounter for preprocedural cardiovascular examination: Secondary | ICD-10-CM | POA: Diagnosis not present

## 2023-03-04 DIAGNOSIS — Z01818 Encounter for other preprocedural examination: Secondary | ICD-10-CM | POA: Insufficient documentation

## 2023-03-04 LAB — CBC
HCT: 42.2 % (ref 39.0–52.0)
Hemoglobin: 15.1 g/dL (ref 13.0–17.0)
MCH: 29.8 pg (ref 26.0–34.0)
MCHC: 35.8 g/dL (ref 30.0–36.0)
MCV: 83.4 fL (ref 80.0–100.0)
Platelets: 232 10*3/uL (ref 150–400)
RBC: 5.06 MIL/uL (ref 4.22–5.81)
RDW: 11.9 % (ref 11.5–15.5)
WBC: 5.2 10*3/uL (ref 4.0–10.5)
nRBC: 0 % (ref 0.0–0.2)

## 2023-03-08 ENCOUNTER — Other Ambulatory Visit: Payer: Self-pay

## 2023-03-08 ENCOUNTER — Ambulatory Visit
Admission: RE | Admit: 2023-03-08 | Discharge: 2023-03-08 | Disposition: A | Discharge status: Home / Self Care | Payer: 59 | Attending: Surgery | Admitting: Surgery

## 2023-03-08 ENCOUNTER — Ambulatory Visit: Payer: 59 | Admitting: Urgent Care

## 2023-03-08 ENCOUNTER — Emergency Department
Admission: EM | Admit: 2023-03-08 | Discharge: 2023-03-09 | Disposition: A | Discharge status: Home / Self Care | Payer: 59 | Source: Home / Self Care | Attending: Emergency Medicine | Admitting: Emergency Medicine

## 2023-03-08 ENCOUNTER — Ambulatory Visit: Payer: 59 | Admitting: Certified Registered"

## 2023-03-08 ENCOUNTER — Encounter: Payer: Self-pay | Admitting: Radiology

## 2023-03-08 ENCOUNTER — Encounter: Payer: Self-pay | Admitting: Surgery

## 2023-03-08 ENCOUNTER — Encounter
Admission: RE | Disposition: A | Discharge status: Home / Self Care | Payer: Self-pay | Source: Home / Self Care | Attending: Surgery

## 2023-03-08 DIAGNOSIS — K429 Umbilical hernia without obstruction or gangrene: Secondary | ICD-10-CM | POA: Insufficient documentation

## 2023-03-08 DIAGNOSIS — R000 Tachycardia, unspecified: Secondary | ICD-10-CM | POA: Insufficient documentation

## 2023-03-08 DIAGNOSIS — K910 Vomiting following gastrointestinal surgery: Secondary | ICD-10-CM | POA: Insufficient documentation

## 2023-03-08 DIAGNOSIS — J45909 Unspecified asthma, uncomplicated: Secondary | ICD-10-CM | POA: Insufficient documentation

## 2023-03-08 DIAGNOSIS — D72829 Elevated white blood cell count, unspecified: Secondary | ICD-10-CM | POA: Insufficient documentation

## 2023-03-08 DIAGNOSIS — D1779 Benign lipomatous neoplasm of other sites: Secondary | ICD-10-CM | POA: Diagnosis not present

## 2023-03-08 DIAGNOSIS — Z9889 Other specified postprocedural states: Secondary | ICD-10-CM

## 2023-03-08 DIAGNOSIS — K219 Gastro-esophageal reflux disease without esophagitis: Secondary | ICD-10-CM | POA: Diagnosis not present

## 2023-03-08 HISTORY — DX: Personal history of other specified conditions: Z87.898

## 2023-03-08 HISTORY — PX: INSERTION OF MESH: SHX5868

## 2023-03-08 HISTORY — DX: Cardiomegaly: I51.7

## 2023-03-08 LAB — BASIC METABOLIC PANEL
Anion gap: 10 (ref 5–15)
BUN: 12 mg/dL (ref 6–20)
CO2: 21 mmol/L — ABNORMAL LOW (ref 22–32)
Calcium: 8.7 mg/dL — ABNORMAL LOW (ref 8.9–10.3)
Chloride: 101 mmol/L (ref 98–111)
Creatinine, Ser: 0.99 mg/dL (ref 0.61–1.24)
GFR, Estimated: >60 mL/min (ref >60)
Glucose, Bld: 162 mg/dL — ABNORMAL HIGH (ref 70–99)
Potassium: 4.3 mmol/L (ref 3.5–5.1)
Sodium: 132 mmol/L — ABNORMAL LOW (ref 135–145)

## 2023-03-08 LAB — CBC WITH DIFFERENTIAL/PLATELET
Abs Immature Granulocytes: 0.05 10*3/uL (ref 0.00–0.07)
Basophils Absolute: 0 10*3/uL (ref 0.0–0.1)
Basophils Relative: 0 %
Eosinophils Absolute: 0 10*3/uL (ref 0.0–0.5)
Eosinophils Relative: 0 %
HCT: 43.1 % (ref 39.0–52.0)
Hemoglobin: 15.7 g/dL (ref 13.0–17.0)
Immature Granulocytes: 0 %
Lymphocytes Relative: 8 %
Lymphs Abs: 0.9 10*3/uL (ref 0.7–4.0)
MCH: 30 pg (ref 26.0–34.0)
MCHC: 36.4 g/dL — ABNORMAL HIGH (ref 30.0–36.0)
MCV: 82.4 fL (ref 80.0–100.0)
Monocytes Absolute: 0.2 10*3/uL (ref 0.1–1.0)
Monocytes Relative: 2 %
Neutro Abs: 10.2 10*3/uL — ABNORMAL HIGH (ref 1.7–7.7)
Neutrophils Relative %: 90 %
Platelets: 266 10*3/uL (ref 150–400)
RBC: 5.23 MIL/uL (ref 4.22–5.81)
RDW: 11.9 % (ref 11.5–15.5)
WBC: 11.3 10*3/uL — ABNORMAL HIGH (ref 4.0–10.5)
nRBC: 0 % (ref 0.0–0.2)

## 2023-03-08 LAB — HEPATIC FUNCTION PANEL
ALT: 16 U/L (ref 0–44)
AST: 24 U/L (ref 15–41)
Albumin: 4.1 g/dL (ref 3.5–5.0)
Alkaline Phosphatase: 65 U/L (ref 38–126)
Bilirubin, Direct: 0.1 mg/dL (ref 0.0–0.2)
Indirect Bilirubin: 0.9 mg/dL (ref 0.3–0.9)
Total Bilirubin: 1 mg/dL (ref 0.3–1.2)
Total Protein: 7.7 g/dL (ref 6.5–8.1)

## 2023-03-08 LAB — LIPASE, BLOOD: Lipase: 29 U/L (ref 11–51)

## 2023-03-08 SURGERY — REPAIR, HERNIA, UMBILICAL, ROBOT-ASSISTED
Anesthesia: General | Site: Abdomen

## 2023-03-08 MED ORDER — CHLORHEXIDINE GLUCONATE 0.12 % MT SOLN
15.0000 mL | Freq: Once | OROMUCOSAL | Status: AC
  Administered 2023-03-08: 15 mL via OROMUCOSAL

## 2023-03-08 MED ORDER — CHLORHEXIDINE GLUCONATE CLOTH 2 % EX PADS
6.0000 | MEDICATED_PAD | Freq: Once | CUTANEOUS | Status: AC
  Administered 2023-03-08: 6 via TOPICAL

## 2023-03-08 MED ORDER — PROPOFOL 10 MG/ML IV BOLUS
INTRAVENOUS | Status: DC | PRN
Start: 2023-03-08 — End: 2023-03-08
  Administered 2023-03-08: 200 mg via INTRAVENOUS

## 2023-03-08 MED ORDER — OXYCODONE HCL 5 MG PO TABS
ORAL_TABLET | ORAL | Status: AC
  Filled 2023-03-08: qty 1

## 2023-03-08 MED ORDER — BUPIVACAINE LIPOSOME 1.3 % IJ SUSP
INTRAMUSCULAR | Status: AC
  Filled 2023-03-08: qty 20

## 2023-03-08 MED ORDER — DOCUSATE SODIUM 100 MG PO CAPS: 100.0000 mg | ORAL_CAPSULE | Freq: Two times a day (BID) | ORAL | 0 refills | Status: AC | PRN

## 2023-03-08 MED ORDER — ORAL CARE MOUTH RINSE: 15.0000 mL | Freq: Once | OROMUCOSAL | Status: AC

## 2023-03-08 MED ORDER — ONDANSETRON HCL 4 MG/2ML IJ SOLN
4.0000 mg | Freq: Once | INTRAMUSCULAR | Status: AC
  Administered 2023-03-08: 4 mg via INTRAVENOUS
  Filled 2023-03-08: qty 2

## 2023-03-08 MED ORDER — SODIUM CHLORIDE (PF) 0.9 % IJ SOLN
INTRAMUSCULAR | Status: AC
  Filled 2023-03-08: qty 50

## 2023-03-08 MED ORDER — HYDROCODONE-ACETAMINOPHEN 5-325 MG PO TABS: 1.0000 | ORAL_TABLET | Freq: Four times a day (QID) | ORAL | 0 refills | Status: AC | PRN

## 2023-03-08 MED ORDER — FENTANYL CITRATE (PF) 100 MCG/2ML IJ SOLN
INTRAMUSCULAR | Status: AC
  Filled 2023-03-08: qty 2

## 2023-03-08 MED ORDER — MIDAZOLAM HCL 2 MG/2ML IJ SOLN
INTRAMUSCULAR | Status: AC
  Filled 2023-03-08: qty 2

## 2023-03-08 MED ORDER — CEFAZOLIN SODIUM-DEXTROSE 2-4 GM/100ML-% IV SOLN
2.0000 g | INTRAVENOUS | Status: AC
  Administered 2023-03-08: 2 g via INTRAVENOUS

## 2023-03-08 MED ORDER — HYDROMORPHONE HCL 1 MG/ML IJ SOLN
INTRAMUSCULAR | Status: DC | PRN
  Administered 2023-03-08: 1 mg via INTRAVENOUS

## 2023-03-08 MED ORDER — DEXAMETHASONE SODIUM PHOSPHATE 10 MG/ML IJ SOLN
INTRAMUSCULAR | Status: DC | PRN
  Administered 2023-03-08: 10 mg via INTRAVENOUS

## 2023-03-08 MED ORDER — SUGAMMADEX SODIUM 200 MG/2ML IV SOLN
INTRAVENOUS | Status: DC | PRN
  Administered 2023-03-08: 200 mg via INTRAVENOUS

## 2023-03-08 MED ORDER — FENTANYL CITRATE (PF) 100 MCG/2ML IJ SOLN
INTRAMUSCULAR | Status: DC | PRN
  Administered 2023-03-08 (×2): 50 ug via INTRAVENOUS

## 2023-03-08 MED ORDER — IBUPROFEN 800 MG PO TABS: 800.0000 mg | ORAL_TABLET | Freq: Three times a day (TID) | ORAL | 0 refills | Status: AC | PRN

## 2023-03-08 MED ORDER — OXYCODONE HCL 5 MG/5ML PO SOLN: 5.0000 mg | Freq: Once | ORAL | Status: AC | PRN

## 2023-03-08 MED ORDER — SODIUM CHLORIDE 0.9 % IV BOLUS
1000.0000 mL | Freq: Once | INTRAVENOUS | Status: AC
  Administered 2023-03-08: 1000 mL via INTRAVENOUS

## 2023-03-08 MED ORDER — ONDANSETRON HCL 4 MG/2ML IJ SOLN
INTRAMUSCULAR | Status: DC | PRN
  Administered 2023-03-08 (×2): 4 mg via INTRAVENOUS

## 2023-03-08 MED ORDER — LACTATED RINGERS IV SOLN: INTRAVENOUS | Status: DC

## 2023-03-08 MED ORDER — OXYCODONE HCL 5 MG PO TABS
5.0000 mg | ORAL_TABLET | Freq: Once | ORAL | Status: AC | PRN
  Administered 2023-03-08: 5 mg via ORAL

## 2023-03-08 MED ORDER — HYDROMORPHONE HCL 1 MG/ML IJ SOLN
INTRAMUSCULAR | Status: AC
  Filled 2023-03-08: qty 1

## 2023-03-08 MED ORDER — ACETAMINOPHEN 500 MG PO TABS
ORAL_TABLET | ORAL | Status: AC
  Filled 2023-03-08: qty 2

## 2023-03-08 MED ORDER — CHLORHEXIDINE GLUCONATE 0.12 % MT SOLN
OROMUCOSAL | Status: AC
  Filled 2023-03-08: qty 15

## 2023-03-08 MED ORDER — ACETAMINOPHEN 500 MG PO TABS
1000.0000 mg | ORAL_TABLET | ORAL | Status: AC
  Administered 2023-03-08: 1000 mg via ORAL

## 2023-03-08 MED ORDER — BUPIVACAINE-EPINEPHRINE 0.5% -1:200000 IJ SOLN
INTRAMUSCULAR | Status: DC | PRN
  Administered 2023-03-08: 30 mL

## 2023-03-08 MED ORDER — GLYCOPYRROLATE 0.2 MG/ML IJ SOLN
INTRAMUSCULAR | Status: DC | PRN
  Administered 2023-03-08: .2 mg via INTRAVENOUS

## 2023-03-08 MED ORDER — LIDOCAINE HCL (CARDIAC) PF 100 MG/5ML IV SOSY
PREFILLED_SYRINGE | INTRAVENOUS | Status: DC | PRN
  Administered 2023-03-08: 100 mg via INTRAVENOUS

## 2023-03-08 MED ORDER — SODIUM CHLORIDE 0.9 % IV SOLN
INTRAVENOUS | Status: DC | PRN
  Administered 2023-03-08: 50 mL

## 2023-03-08 MED ORDER — ACETAMINOPHEN 325 MG PO TABS: 650.0000 mg | ORAL_TABLET | Freq: Three times a day (TID) | ORAL | 0 refills | Status: AC | PRN

## 2023-03-08 MED ORDER — MIDAZOLAM HCL 2 MG/2ML IJ SOLN
INTRAMUSCULAR | Status: DC | PRN
  Administered 2023-03-08: 2 mg via INTRAVENOUS

## 2023-03-08 MED ORDER — GABAPENTIN 300 MG PO CAPS
ORAL_CAPSULE | ORAL | Status: AC
  Filled 2023-03-08: qty 1

## 2023-03-08 MED ORDER — GABAPENTIN 300 MG PO CAPS
300.0000 mg | ORAL_CAPSULE | ORAL | Status: AC
  Administered 2023-03-08: 300 mg via ORAL

## 2023-03-08 MED ORDER — DEXMEDETOMIDINE HCL IN NACL 200 MCG/50ML IV SOLN
INTRAVENOUS | Status: DC | PRN
  Administered 2023-03-08: 12 ug via INTRAVENOUS
  Administered 2023-03-08: 8 ug via INTRAVENOUS

## 2023-03-08 MED ORDER — EPINEPHRINE PF 1 MG/ML IJ SOLN
INTRAMUSCULAR | Status: AC
  Filled 2023-03-08: qty 1

## 2023-03-08 MED ORDER — ROCURONIUM BROMIDE 100 MG/10ML IV SOLN
INTRAVENOUS | Status: DC | PRN
  Administered 2023-03-08: 50 mg via INTRAVENOUS

## 2023-03-08 MED ORDER — FENTANYL CITRATE (PF) 100 MCG/2ML IJ SOLN: 25.0000 ug | INTRAMUSCULAR | Status: DC | PRN

## 2023-03-08 MED ORDER — CEFAZOLIN SODIUM-DEXTROSE 2-4 GM/100ML-% IV SOLN
INTRAVENOUS | Status: AC
  Filled 2023-03-08: qty 100

## 2023-03-08 MED ORDER — BUPIVACAINE HCL (PF) 0.5 % IJ SOLN
INTRAMUSCULAR | Status: AC
  Filled 2023-03-08: qty 30

## 2023-03-08 MED ORDER — SODIUM CHLORIDE FLUSH 0.9 % IV SOLN
INTRAVENOUS | Status: AC
  Filled 2023-03-08: qty 30

## 2023-03-08 SURGICAL SUPPLY — 51 items
ADH SKN CLS APL DERMABOND .7 (GAUZE/BANDAGES/DRESSINGS) ×2
BLADE SURG SZ11 CARB STEEL (BLADE) ×2 IMPLANT
COVER TIP SHEARS 8 DVNC (MISCELLANEOUS) ×2 IMPLANT
COVER WAND RF STERILE (DRAPES) ×2 IMPLANT
DERMABOND ADVANCED .7 DNX12 (GAUZE/BANDAGES/DRESSINGS) ×2 IMPLANT
DRAPE ARM DVNC X/XI (DISPOSABLE) ×6 IMPLANT
DRAPE COLUMN DVNC XI (DISPOSABLE) ×2 IMPLANT
ELECT CAUTERY BLADE 6.4 (BLADE) ×2 IMPLANT
ELECT REM PT RETURN 9FT ADLT (ELECTROSURGICAL) ×2
ELECTRODE REM PT RTRN 9FT ADLT (ELECTROSURGICAL) ×2 IMPLANT
FORCEPS BPLR FENES DVNC XI (FORCEP) ×2 IMPLANT
GLOVE BIOGEL PI IND STRL 7.0 (GLOVE) ×4 IMPLANT
GLOVE SURG SYN 6.5 ES PF (GLOVE) ×10 IMPLANT
GLOVE SURG SYN 6.5 PF PI (GLOVE) ×4 IMPLANT
GOWN STRL REUS W/ TWL LRG LVL3 (GOWN DISPOSABLE) ×6 IMPLANT
GOWN STRL REUS W/TWL LRG LVL3 (GOWN DISPOSABLE) ×10
GRASPER SUT TROCAR 14GX15 (MISCELLANEOUS) IMPLANT
IRRIGATOR SUCT 8 DISP DVNC XI (IRRIGATION / IRRIGATOR) IMPLANT
IV NS 1000ML (IV SOLUTION)
IV NS 1000ML BAXH (IV SOLUTION) IMPLANT
LABEL OR SOLS (LABEL) ×2 IMPLANT
MANIFOLD NEPTUNE II (INSTRUMENTS) ×2 IMPLANT
MESH PROGRIP HERNIA FLAT 15X15 (Mesh General) IMPLANT
NDL DRIVE SUT CUT DVNC (INSTRUMENTS) ×2 IMPLANT
NDL HYPO 22X1.5 SAFETY MO (MISCELLANEOUS) ×2 IMPLANT
NDL INSUFFLATION 14GA 120MM (NEEDLE) ×2 IMPLANT
NEEDLE DRIVE SUT CUT DVNC (INSTRUMENTS) ×2 IMPLANT
NEEDLE HYPO 22X1.5 SAFETY MO (MISCELLANEOUS) ×2 IMPLANT
NEEDLE INSUFFLATION 14GA 120MM (NEEDLE) ×2 IMPLANT
OBTURATOR OPTICAL STND 8 DVNC (TROCAR) ×2
OBTURATOR OPTICALSTD 8 DVNC (TROCAR) ×2 IMPLANT
PACK LAP CHOLECYSTECTOMY (MISCELLANEOUS) ×2 IMPLANT
PENCIL SMOKE EVACUATOR (MISCELLANEOUS) ×2 IMPLANT
SCISSORS MNPLR CVD DVNC XI (INSTRUMENTS) ×2 IMPLANT
SEAL UNIV 5-12 XI (MISCELLANEOUS) ×6 IMPLANT
SET TUBE SMOKE EVAC HIGH FLOW (TUBING) ×2 IMPLANT
SOL ELECTROSURG ANTI STICK (MISCELLANEOUS) ×2
SOLUTION ELECTROSURG ANTI STCK (MISCELLANEOUS) ×2 IMPLANT
SUT MNCRL AB 4-0 PS2 18 (SUTURE) ×2 IMPLANT
SUT STRATAFIX 0 PDS+ CT-2 23 (SUTURE) ×2
SUT V-LOC 90 ABS DVC 3-0 CL (SUTURE) ×4 IMPLANT
SUT VIC AB 3-0 SH 27 (SUTURE) ×2
SUT VIC AB 3-0 SH 27X BRD (SUTURE) ×2 IMPLANT
SUT VICRYL 0 UR6 27IN ABS (SUTURE) ×2 IMPLANT
SUTURE STRATFX 0 PDS+ CT-2 23 (SUTURE) ×2 IMPLANT
SYR 30ML LL (SYRINGE) ×2 IMPLANT
SYSTEM WECK SHIELD CLOSURE (TROCAR) IMPLANT
TRAP FLUID SMOKE EVACUATOR (MISCELLANEOUS) ×2 IMPLANT
TRAY FOLEY MTR SLVR 16FR STAT (SET/KITS/TRAYS/PACK) ×2 IMPLANT
TROCAR Z-THREAD FIOS 5X100MM (TROCAR) IMPLANT
WATER STERILE IRR 500ML POUR (IV SOLUTION) ×2 IMPLANT

## 2023-03-08 NOTE — ED Provider Notes (Signed)
[facility] Provider Note    Event Date/Time   First MD Initiated Contact with Patient 03/08/23 2332     (approximate)   History   Post-op Problem   HPI  David Henson is a 62 y.o. male who presents to the ED from home with a chief complaint of nausea and vomiting.  Patient had umbilical hernia repair by Dr. Tonna Boehringer this morning.  Has vomited 4 times every time he tried to eat or drink small amounts.  Called the nurse line who directed him to the ED for further evaluation.  Denies fever/chills, cough, chest pain, shortness of breath, dysuria or diarrhea.  Has not passed gas or had a bowel movement since surgery.     Past Medical History   Past Medical History:  Diagnosis Date  . Allergic rhinitis   . Asthma    no problems since taking singulair and PPI  . GERD (gastroesophageal reflux disease)   . History of chronic cough    since teens, dx gerd 2017  . Mild cardiomegaly      Active Problem List  There are no problems to display for this patient.    Past Surgical History   Past Surgical History:  Procedure Laterality Date  . COLONOSCOPY    . DENTAL SURGERY     removed tooth and placed dental implant  . HERNIA REPAIR    . INSERTION OF MESH  03/08/2023   Procedure: INSERTION OF MESH;  Surgeon: Teonna Coonan Amabile, DO;  Location: [facility] ORS;  Service: General;;     Home Medications   Prior to Admission medications   Medication Sig Start Date End Date Taking? Authorizing Provider  acetaminophen (TYLENOL) 325 MG tablet Take 2 tablets (650 mg total) by mouth every 8 (eight) hours as needed for mild pain. 03/08/23 04/07/23  Sherrice Creekmore Amabile, DO  cetirizine (ZYRTEC) 10 MG tablet Take 10 mg by mouth daily. Mainly during allergy season    [provider]  docusate sodium (COLACE) 100 MG capsule Take 1 capsule (100 mg total) by mouth 2 (two) times daily as needed for up to 10 days for mild constipation. 03/08/23 03/18/23  Tonna Boehringer, Isami, DO  esomeprazole  (NEXIUM) 20 MG capsule Take 20 mg by mouth daily at 12 noon.    [provider]  HYDROcodone-acetaminophen (NORCO) 5-325 MG tablet Take 1 tablet by mouth every 6 (six) hours as needed for up to 6 doses for moderate pain. 03/08/23   Tonna Boehringer, Isami, DO  ibuprofen (ADVIL) 800 MG tablet Take 1 tablet (800 mg total) by mouth every 8 (eight) hours as needed for mild pain or moderate pain. 03/08/23   Tonna Boehringer, Isami, DO  montelukast (SINGULAIR) 10 MG tablet Take 10 mg by mouth at bedtime.    [provider]     Allergies  Patient has no known allergies.   Family History   Family History  Problem Relation Age of Onset  . Asthma Maternal Aunt   . Asthma Mother   . COPD Maternal Grandfather      Physical Exam  Triage Vital Signs: ED Triage Vitals  Encounter Vitals Group     BP 03/08/23 2207 (!) 132/97     Systolic BP Percentile --      Diastolic BP Percentile --      Pulse Rate 03/08/23 2207 (!) 110     Resp 03/08/23 2207 17     Temp 03/08/23 2207 97.7 F (36.5 C)     Temp Source  03/08/23 2207 Oral     SpO2 03/08/23 2204 92 %     Weight 03/08/23 2206 185 lb (83.9 kg)     Height 03/08/23 2206 5\' 8"  (1.727 m)     Head Circumference --      Peak Flow --      Pain Score --      Pain Loc --      Pain Education --      Exclude from Growth Chart --     Updated Vital Signs: BP (!) 132/97 (BP Location: Left Arm)   Pulse (!) 110   Temp 97.7 F (36.5 C) (Oral)   Resp 17   Ht 5\' 8"  (1.727 m)   Wt 83.9 kg   SpO2 95%   BMI 28.13 kg/m    General: Awake, no distress.  CV:  Tachycardic.  Good peripheral perfusion.  Resp:  Normal effort.  RRR. Abd:  Incisions C/D/I.  Mild general soreness to palpation without rebound or guarding.  Mild distention.  Other:  No truncal vesicles.   ED Results / Procedures / Treatments  Labs (all labs ordered are listed, but only abnormal results are displayed) Labs Reviewed  CBC WITH DIFFERENTIAL/PLATELET - Abnormal; Notable for the  following components:      Result Value   WBC 11.3 (*)    MCHC 36.4 (*)    Neutro Abs 10.2 (*)    All other components within normal limits  BASIC METABOLIC PANEL - Abnormal; Notable for the following components:   Sodium 132 (*)    CO2 21 (*)    Glucose, Bld 162 (*)    Calcium 8.7 (*)    All other components within normal limits  HEPATIC FUNCTION PANEL  LIPASE, BLOOD  URINALYSIS, ROUTINE W REFLEX MICROSCOPIC     EKG  None   RADIOLOGY I have independently visualized and interpreted patient's CT scan as well as noted the radiology interpretation:  CT abdomen/pelvis: Postoperative changes, no acute, gallstones  Official radiology report(s): CT ABDOMEN PELVIS W CONTRAST  Result Date: 03/09/2023 CLINICAL DATA:  Postop nausea vomiting abdominal pain hernia repaired this morning EXAM: CT ABDOMEN AND PELVIS WITH CONTRAST TECHNIQUE: Multidetector CT imaging of the abdomen and pelvis was performed using the standard protocol following bolus administration of intravenous contrast. RADIATION DOSE REDUCTION: This exam was performed according to the departmental dose-optimization program which includes automated exposure control, adjustment of the mA and/or kV according to patient size and/or use of iterative reconstruction technique. CONTRAST:  OMNIPAQUE IOHEXOL 300 MG/ML  SOLN COMPARISON:  None Available. FINDINGS: Lower chest: Lung bases demonstrate no acute airspace disease. Hepatobiliary: Gallstones. No biliary dilatation or focal hepatic abnormality. Pancreas: Unremarkable. No pancreatic ductal dilatation or surrounding inflammatory changes. Spleen: Normal in size without focal abnormality. Adrenals/Urinary Tract: Adrenal glands are unremarkable. Kidneys are normal, without renal calculi, focal lesion, or hydronephrosis. Bladder is unremarkable. Stomach/Bowel: Stomach is within normal limits. Appendix appears normal. No evidence of bowel wall thickening, distention, or inflammatory  changes. Vascular/Lymphatic: Nonaneurysmal aorta.  No suspicious lymph nodes. Reproductive: Prostate is unremarkable. Other: Negative for pelvic effusion. Minimal focus of intraperitoneal gas likely postoperative, this is seen on series 2, image 30. Majority of gas is extra peritoneal and within the anterior abdominal wall. Soft tissue stranding at the umbilical region likely related to recent hernia repair. Small fat containing inguinal hernias. Musculoskeletal: No acute or significant osseous findings. IMPRESSION: 1. Postoperative changes from recent umbilical hernia repair. Minimal focus of intraperitoneal gas likely postoperative.  Gas within abdominal wall soft tissues consistent with recent surgery. 2. Negative for bowel obstruction. 3. Gallstones. Electronically Signed   By: Jasmine Pang M.D.   On: 03/09/2023 00:52     PROCEDURES:  Critical Care performed: No  Procedures   MEDICATIONS ORDERED IN ED: Medications  sodium chloride 0.9 % bolus 1,000 mL (1,000 mLs Intravenous New Bag/Given 03/08/23 2359)  ondansetron (ZOFRAN) injection 4 mg (4 mg Intravenous Given 03/08/23 2358)  iohexol (OMNIPAQUE) 300 MG/ML solution 100 mL (100 mLs Intravenous Contrast Given 03/09/23 0038)     IMPRESSION / MDM / ASSESSMENT AND PLAN / ED COURSE  I reviewed the triage vital signs and the nursing notes.                             62 year old male presenting with nausea/vomiting x 4 status post umbilical hernia repair this morning. Differential diagnosis includes, but is not limited to, acute appendicitis, renal colic, testicular torsion, urinary tract infection/pyelonephritis, prostatitis,  epididymitis, diverticulitis, small bowel obstruction or ileus, colitis, abdominal aortic aneurysm, gastroenteritis, hernia, etc. I personally reviewed patient's records and note his postop note from today.  Patient's presentation is most consistent with acute presentation with potential threat to life or bodily  function.  Laboratory results demonstrate minimal leukocytosis WBC 11.3, normal electrolytes.  Will check LFT/lipase, UA.  Initiate IV fluid hydration, IV Zofran for nausea.  Proceed with CT abdomen/pelvis to evaluate for postoperative complications.  It is noted patient was given Exparel.  Clinical Course as of 03/09/23 0520  Sat Mar 09, 2023  0111 Updated patient on CT imaging results.  He has been able to tolerate ginger ale without emesis.  Tells me he finished a course of amoxicillin 5 days ago for sinus infection.  He was not sent home on antiemetics so will discharge home with as needed Zofran prescription.  Awaiting results of urine. [JS]  0142 UA negative.  Strict return precautions given.  Patient verbalizes understanding agrees with plan of care. [JS]    Clinical Course User Index [JS] Irean Hong, MD     FINAL CLINICAL IMPRESSION(S) / ED DIAGNOSES   Final diagnoses:  Post-operative nausea and vomiting     Rx / DC Orders   ED Discharge Orders     None        Note:  This document was prepared using Dragon voice recognition software and may include unintentional dictation errors.   Irean Hong, MD 03/09/23 [phone]

## 2023-03-08 NOTE — Interval H&P Note (Signed)
History and Physical Interval Note:  03/08/2023 8:39 AM  David Henson  has presented today for surgery, with the diagnosis of umbilical hernia w/o obstruction or gangrene K42.9.  The various methods of treatment have been discussed with the patient and family. After consideration of risks, benefits and other options for treatment, the patient has consented to  Procedure(s): XI ROBOT ASSISTED UMBILICAL HERNIA REPAIR (N/A) as a surgical intervention.  [name] history has been reviewed, patient examined, no change in status, stable for surgery.  I have reviewed the patient's chart and labs.  Questions were answered to the patient's satisfaction.     Maverick Dieudonne Tonna Boehringer

## 2023-03-08 NOTE — Anesthesia Procedure Notes (Signed)
Procedure Name: Intubation Date/Time: 03/08/2023 9:10 AM  Performed by: Mohammed Kindle, CRNAPre-anesthesia Checklist: Patient identified, Emergency Drugs available, Suction available and Patient being monitored Patient Re-evaluated:Patient Re-evaluated prior to induction Oxygen Delivery Method: Circle system utilized Preoxygenation: Pre-oxygenation with 100% oxygen Induction Type: IV induction Ventilation: Mask ventilation without difficulty Laryngoscope Size: McGraph and 3 Grade View: Grade I Tube type: Oral Number of attempts: 1 Airway Equipment and Method: Stylet Placement Confirmation: ETT inserted through vocal cords under direct vision, positive ETCO2, breath sounds checked- equal and bilateral and CO2 detector Secured at: 21 cm Tube secured with: Tape Dental Injury: Teeth and Oropharynx as per pre-operative assessment

## 2023-03-08 NOTE — ED Triage Notes (Signed)
Pt states he had a abd hernia repaired this morning at the surgery center and has attempted to eat small amounts with his medication and drink fluids and has thrown up 4 different times. Pt was advised to come here due to concern that the repair will be compromised by the vomiting. Pt states pain isnt bad but can't keep anything down.

## 2023-03-08 NOTE — Discharge Instructions (Addendum)
Hernia repair, Care After This sheet gives you information about how to care for yourself after your procedure. Your health care provider may also give you more specific instructions. If you have problems or questions, contact your health care provider. What can I expect after the procedure? After your procedure, it is common to have the following: Pain in your abdomen, especially in the incision areas. You will be given medicine to control the pain. Tiredness. This is a normal part of the recovery process. Your energy level will return to normal over the next several weeks. Changes in your bowel movements, such as constipation or needing to go more often. Talk with your health care provider about how to manage this. Follow these instructions at home: Medicines  tylenol and advil as needed for discomfort.  Please alternate between the two every four hours as needed for pain.    Use narcotics, if prescribed, only when tylenol and motrin is not enough to control pain.  325-650mg every 8hrs to max of 3000mg/24hrs (including the 325mg in every norco dose) for the tylenol.    Advil up to 800mg per dose every 8hrs as needed for pain.   PLEASE RECORD NUMBER OF PILLS TAKEN UNTIL NEXT FOLLOW UP APPT.  THIS WILL HELP DETERMINE HOW READY YOU ARE TO BE RELEASED FROM ANY ACTIVITY RESTRICTIONS Do not drive or use heavy machinery while taking prescription pain medicine. Do not drink alcohol while taking prescription pain medicine.  Incision care    Follow instructions from your health care provider about how to take care of your incision areas. Make sure you: Keep your incisions clean and dry. Wash your hands with soap and water before and after applying medicine to the areas, and before and after changing your bandage (dressing). If soap and water are not available, use hand sanitizer. Change your dressing as told by your health care provider. Leave stitches (sutures), skin glue, or adhesive strips in  place. These skin closures may need to stay in place for 2 weeks or longer. If adhesive strip edges start to loosen and curl up, you may trim the loose edges. Do not remove adhesive strips completely unless your health care provider tells you to do that. Do not wear tight clothing over the incisions. Tight clothing may rub and irritate the incision areas, which may cause the incisions to open. Do not take baths, swim, or use a hot tub until your health care provider approves. OK TO SHOWER IN 24HRS.   Check your incision area every day for signs of infection. Check for: More redness, swelling, or pain. More fluid or blood. Warmth. Pus or a bad smell. Activity Avoid lifting anything that is heavier than 10 lb (4.5 kg) for 2 weeks or until your health care provider says it is okay. No pushing/pulling greater than 30lbs You may resume normal activities as told by your health care provider. Ask your health care provider what activities are safe for you. Take rest breaks during the day as needed. Eating and drinking Follow instructions from your health care provider about what you can eat after surgery. To prevent or treat constipation while you are taking prescription pain medicine, your health care provider may recommend that you: Drink enough fluid to keep your urine clear or pale yellow. Take over-the-counter or prescription medicines. Eat foods that are high in fiber, such as fresh fruits and vegetables, whole grains, and beans. Limit foods that are high in fat and processed sugars, such as fried and   sweet foods. General instructions Ask your health care provider when you will need an appointment to get your sutures or staples removed. Keep all follow-up visits as told by your health care provider. This is important. Contact a health care provider if: You have more redness, swelling, or pain around your incisions. You have more fluid or blood coming from the incisions. Your incisions feel  warm to the touch. You have pus or a bad smell coming from your incisions or your dressing. You have a fever. You have an incision that breaks open (edges not staying together) after sutures or staples have been removed. You develop a rash. You have chest pain or difficulty breathing. You have pain or swelling in your legs. You feel light-headed or you faint. Your abdomen swells (becomes distended). You have nausea or vomiting. You have blood in your stool (feces). This information is not intended to replace advice given to you by your health care provider. Make sure you discuss any questions you have with your health care provider. Document Released: 02/16/2005 Document Revised: 04/18/2018 Document Reviewed: 04/30/2016 Elsevier Interactive Patient Education  2019 [employer]   AMBULATORY SURGERY  DISCHARGE INSTRUCTIONS   The drugs that you were given will stay in your system until tomorrow so for the next 24 hours you should not:  Drive an automobile Make any legal decisions Drink any alcoholic beverage   You may resume regular meals tomorrow.  Today it is better to start with liquids and gradually work up to solid foods.  You may eat anything you prefer, but it is better to start with liquids, then soup and crackers, and gradually work up to solid foods.   Please notify your doctor immediately if you have any unusual bleeding, trouble breathing, redness and pain at the surgery site, drainage, fever, or pain not relieved by medication.    Additional Instructions:        Please contact your physician with any problems or Same Day Surgery at [phone], Monday through Friday 6 am to 4 pm, or [facility] [place] Main number at [phone].  

## 2023-03-08 NOTE — Transfer of Care (Signed)
Immediate Anesthesia Transfer of Care Note  Patient: David Henson  Procedure(s) Performed: XI ROBOT ASSISTED UMBILICAL HERNIA REPAIR (Abdomen) INSERTION OF MESH  Patient Location: PACU  Anesthesia Type:General  Level of Consciousness: awake, drowsy, and patient cooperative  Airway & Oxygen Therapy: Patient Spontanous Breathing and Patient connected to face mask oxygen  Post-op Assessment: Report given to RN and Post -op Vital signs reviewed and stable  Post vital signs: Reviewed and stable  Last Vitals:  Vitals Value Taken Time  BP    Temp    Pulse 65 03/08/23 1052  Resp 14 03/08/23 1052  SpO2 91 % 03/08/23 1052  Vitals shown include unfiled device data.  Last Pain:  Vitals:   03/08/23 0821  TempSrc: Temporal  PainSc: 0-No pain      Patients Stated Pain Goal: 0 (03/08/23 1610)  Complications: No notable events documented.

## 2023-03-08 NOTE — Anesthesia Preprocedure Evaluation (Signed)
Anesthesia Evaluation  Patient identified by MRN, date of birth, ID band Patient awake    Reviewed: Allergy & Precautions, NPO status , Patient's Chart, lab work & pertinent test results  History of Anesthesia Complications Negative for: history of anesthetic complications  Airway Mallampati: III  TM Distance: >3 FB Neck ROM: full    Dental  (+) Dental Advidsory Given, Teeth Intact   Pulmonary asthma    Pulmonary exam normal        Cardiovascular (-) angina (-) Past MI and (-) CABG negative cardio ROS Normal cardiovascular exam     Neuro/Psych negative neurological ROS  negative psych ROS   GI/Hepatic Neg liver ROS,GERD  Medicated,,  Endo/Other  negative endocrine ROS    Renal/GU      Musculoskeletal   Abdominal   Peds  Hematology negative hematology ROS (+)   Anesthesia Other Findings Past Medical History: No date: Allergic rhinitis No date: Asthma     Comment:  no problems since taking singulair and PPI No date: GERD (gastroesophageal reflux disease) No date: History of chronic cough     Comment:  since teens, dx gerd 2017 No date: Mild cardiomegaly  Past Surgical History: No date: COLONOSCOPY No date: DENTAL SURGERY     Comment:  removed tooth and placed dental implant  BMI    Body Mass Index: 28.13 kg/m      Reproductive/Obstetrics negative OB ROS                             Anesthesia Physical Anesthesia Plan  ASA: 2  Anesthesia Plan: General ETT and General   Post-op Pain Management:    Induction: Intravenous  PONV Risk Score and Plan: 3 and Ondansetron, TIVA, Midazolam and Dexamethasone  Airway Management Planned: Oral ETT  Additional Equipment:   Intra-op Plan:   Post-operative Plan: Extubation in OR  Informed Consent: I have reviewed the patients History and Physical, chart, labs and discussed the procedure including the risks, benefits and  alternatives for the proposed anesthesia with the patient or authorized representative who has indicated his/her understanding and acceptance.     Dental Advisory Given  Plan Discussed with: Anesthesiologist, CRNA and Surgeon  Anesthesia Plan Comments: (Patient consented for risks of anesthesia including but not limited to:  - adverse reactions to medications - damage to eyes, teeth, lips or other oral mucosa - nerve damage due to positioning  - sore throat or hoarseness - Damage to heart, brain, nerves, lungs, other parts of body or loss of life  Patient voiced understanding.)       Anesthesia Quick Evaluation

## 2023-03-08 NOTE — Anesthesia Postprocedure Evaluation (Signed)
Anesthesia Post Note  Patient: David Henson  Procedure(s) Performed: XI ROBOT ASSISTED UMBILICAL HERNIA REPAIR (Abdomen) INSERTION OF MESH  Patient location during evaluation: PACU Anesthesia Type: General Level of consciousness: awake and alert Pain management: pain level controlled Vital Signs Assessment: post-procedure vital signs reviewed and stable Respiratory status: spontaneous breathing, nonlabored ventilation, respiratory function stable and patient connected to nasal cannula oxygen Cardiovascular status: blood pressure returned to baseline and stable Postop Assessment: no apparent nausea or vomiting Anesthetic complications: no  No notable events documented.   Last Vitals:  Vitals:   03/08/23 1130 03/08/23 1145  BP: 124/88   Pulse: 77   Resp: 13   Temp:  (!) 36.4 C  SpO2: 97%     Last Pain:  Vitals:   03/08/23 1145  TempSrc:   PainSc: 3                  Stephanie Coup

## 2023-03-08 NOTE — Op Note (Signed)
Preoperative diagnosis: umbilical, initial, reducible hernia Postoperative diagnosis: same  Procedure: Robotic assisted laparoscopic umbilical  hernia repair with mesh  Anesthesia: general  Surgeon: Sung Amabile  Wound Classification: Clean  Specimen: none  Complications: None  Estimated Blood Loss: 10ml  Indications:see HPI  Findings: umbilical hernia defect measuring 2cm x 2cm 4. Tension free repair achieved with ProGrip mesh and suture 5. Adequate hemostasis  Description of procedure: The patient was brought to the operating room and general anesthesia was induced. A time-out was completed verifying correct patient, procedure, site, positioning, and implant(s) and/or special equipment prior to beginning this procedure. Antibiotics were administered prior to making the incision. SCDs placed. The anterior abdominal wall was prepped and draped in the standard sterile fashion.   Palmer's point chosen for entry.  Veress needle placed and abdomen insufflated to 15cm without any dramatic increase in pressure.  Needle removed and optiview technique used to place 5mm port at same point.  No injury noted during placement. 3 additional ports, 8mm x2 and 12mm, along left lateral aspect placed.   Xi robot then docked into place.  umbilical hernia defect measuring 2cm x 2cm was noted.  Preperitoneal plane was entered by making a incision along the right lateral aspect of the peritoneum.  This flap was carried across the abdomen to the other side of the defect, reducing all hernia contents and preperitoneal lipoma within the hernia defect.  Small amounts of bleeding was controlled with cautery.  Once adequate exposure of the defect and adequate space was created to place the mesh, insufflation dropped to 8mm and transfacial suture with 0 stratafix used to primarily close defect under minimal tension.  Overlying skin was tacked with suture to secure umbilical stalk to fascia.   ProGrip mesh cut to size  with adequate overlap around the defect edges was placed within the abdominal cavity through 12mm port and secured to the abdominal wall centered over the defect. The peritoneal flap was then closed with a running 3-0 V lock.  Robot was undocked.  The 12mm cannula was removed and port site was closed using Efx Shield device and 0 vicryl suture, ensuring no bowels were injured during this process.  Abdomen then desufflated while camera within abdomen to ensure no signs of new bleed prior to removing camera and rest of ports completely.  All skin incisions closed with runninrg 4-0 Monocryl in a subcuticular fashion.  All wounds then dressed with Dermabond.  Patient was then successfully awakened and transferred to PACU in stable condition.  At the end of the procedure sponge and instrument counts were correct.

## 2023-03-08 NOTE — H&P (Signed)
Subjective:   CC: Umbilical hernia without obstruction and without gangrene [K42.9]  HPI:  David Henson is a 62 y.o. male who was referred by Sandie Ano, MD for evaluation of above. First noted several years ago, recently increasing in size.  No major symptoms but will like to get if fixed.  Past Medical History:  has a past medical history of Allergic state, Asthma without status asthmaticus (HHS-HCC), and GERD (gastroesophageal reflux disease).  Past Surgical History:  Past Surgical History:  Procedure Laterality Date   COLONOSCOPY  07/2013   10 yrs    Family History: family history includes Asthma in his mother; Breast cancer in his mother.  Social History:  reports that he has never smoked. He has never used smokeless tobacco. He reports that he does not drink alcohol and does not use drugs.  Current Medications: has a current medication list which includes the following prescription(s): cetirizine hcl, esomeprazole, and montelukast.  Allergies:  Allergies as of 12/31/2022   (No Known Allergies)    ROS:  A 15 point review of systems was performed and pertinent positives and negatives noted in HPI   Objective:     BP (!) 143/85   Pulse 94   Ht 170.2 cm (5\' 7" )   Wt 86.2 kg (190 lb)   BMI 29.76 kg/m   Constitutional :  Alert, cooperative, no distress  Lymphatics/Throat:  Supple, no lymphadenopathy  Respiratory:  clear to auscultation bilaterally  Cardiovascular:  regular rate and rhythm  Gastrointestinal: soft, non-tender; bowel sounds normal; no masses,  no organomegaly. umbilical hernia noted.  small, reducible, and no overlying skin changes  Musculoskeletal: Steady gait and movement  Skin: Cool and moist  Psychiatric: Normal affect, non-agitated, not confused       LABS:     RADS: N/a3 Assessment:       Umbilical hernia without obstruction and without gangrene [K42.9]  Plan:     1. Umbilical hernia without obstruction and without gangrene  [K42.9]   Discussed the risk of surgery including recurrence, which can be up to 50% in the case of incisional or complex hernias, possible use of prosthetic materials (mesh) and the increased risk of mesh infxn if used, bleeding, chronic pain, post-op infxn, post-op SBO or ileus, and possible re-operation to address said risks. The risks of general anesthetic, if used, includes MI, CVA, sudden death or even reaction to anesthetic medications also discussed. Alternatives include continued observation.  Benefits include possible symptom relief, prevention of incarceration, strangulation, enlargement in size over time, and the risk of emergency surgery in the face of strangulation.   Typical post-op recovery time of 3-5 days with 2 weeks of activity restrictions were also discussed.  ED return precautions given for sudden increase in pain, size of hernia with accompanying fever, nausea, and/or vomiting.  The patient verbalized understanding and all questions were answered to the patient's satisfaction.   2. Patient has elected to proceed with surgical treatment. Procedure will be scheduled. robotic assisted laparoscopic  labs/images/medications/previous chart entries reviewed personally and relevant changes/updates noted above.

## 2023-03-09 ENCOUNTER — Emergency Department: Payer: 59

## 2023-03-09 MED ORDER — IOHEXOL 300 MG/ML  SOLN
100.0000 mL | Freq: Once | INTRAMUSCULAR | Status: AC | PRN
  Administered 2023-03-09: 100 mL via INTRAVENOUS

## 2023-03-09 MED ORDER — ONDANSETRON 4 MG PO TBDP: 4.0000 mg | ORAL_TABLET | Freq: Three times a day (TID) | ORAL | 0 refills | Status: AC | PRN

## 2023-03-09 MED ORDER — ONDANSETRON 4 MG PO TBDP
4.0000 mg | ORAL_TABLET | Freq: Once | ORAL | Status: AC
  Administered 2023-03-09: 4 mg via ORAL
  Filled 2023-03-09: qty 1

## 2023-03-09 NOTE — Discharge Instructions (Signed)
You may take Zofran as needed for nausea.  Clear liquids x 12 hours, then bland diet x 3 days, then slowly advance diet as tolerated.  Return to the ER for worsening symptoms, persistent vomiting, difficulty breathing, fever or other concerns.

## 2024-02-28 ENCOUNTER — Ambulatory Visit: Payer: Self-pay

## 2024-02-28 DIAGNOSIS — Z1211 Encounter for screening for malignant neoplasm of colon: Secondary | ICD-10-CM | POA: Diagnosis present

## 2024-02-28 DIAGNOSIS — K5730 Diverticulosis of large intestine without perforation or abscess without bleeding: Secondary | ICD-10-CM | POA: Diagnosis not present

## 2024-02-28 DIAGNOSIS — K642 Third degree hemorrhoids: Secondary | ICD-10-CM | POA: Diagnosis not present

## 2024-02-28 DIAGNOSIS — D124 Benign neoplasm of descending colon: Secondary | ICD-10-CM | POA: Diagnosis not present
